# Patient Record
Sex: Female | Born: 1965
Health system: Southern US, Community
[De-identification: ages and names within clinical notes are randomized; demographics above are authoritative.]

## PROBLEM LIST (undated history)

## (undated) DIAGNOSIS — F32A Depression, unspecified: Secondary | ICD-10-CM

## (undated) DIAGNOSIS — K629 Disease of anus and rectum, unspecified: Secondary | ICD-10-CM

## (undated) DIAGNOSIS — M8580 Other specified disorders of bone density and structure, unspecified site: Secondary | ICD-10-CM

## (undated) DIAGNOSIS — F329 Major depressive disorder, single episode, unspecified: Secondary | ICD-10-CM

## (undated) DIAGNOSIS — E0590 Thyrotoxicosis, unspecified without thyrotoxic crisis or storm: Secondary | ICD-10-CM

## (undated) DIAGNOSIS — E079 Disorder of thyroid, unspecified: Secondary | ICD-10-CM

## (undated) DIAGNOSIS — K5790 Diverticulosis of intestine, part unspecified, without perforation or abscess without bleeding: Secondary | ICD-10-CM

## (undated) DIAGNOSIS — F338 Other recurrent depressive disorders: Secondary | ICD-10-CM

## (undated) HISTORY — DX: Depression, unspecified: F32.A

## (undated) HISTORY — PX: WISDOM TOOTH EXTRACTION: SHX21

## (undated) HISTORY — PX: BILATERAL SALPINGOOPHORECTOMY: SHX1223

## (undated) HISTORY — DX: Disorder of thyroid, unspecified: E07.9

## (undated) HISTORY — DX: Major depressive disorder, single episode, unspecified: F32.9

## (undated) HISTORY — PX: COLONOSCOPY: SHX174

---

## 2002-06-10 HISTORY — PX: BREAST SURGERY: SHX581

## 2003-05-11 HISTORY — PX: BREAST ENHANCEMENT SURGERY: SHX7

## 2003-06-11 HISTORY — PX: AUGMENTATION MAMMAPLASTY: SUR837

## 2007-06-11 HISTORY — PX: ABDOMINAL HYSTERECTOMY: SHX81

## 2011-09-17 ENCOUNTER — Encounter: Payer: Self-pay | Admitting: Family Medicine

## 2011-09-17 ENCOUNTER — Ambulatory Visit (INDEPENDENT_AMBULATORY_CARE_PROVIDER_SITE_OTHER): Payer: Federal, State, Local not specified - PPO | Admitting: Family Medicine

## 2011-09-17 VITALS — BP 113/73 | HR 68

## 2011-09-17 DIAGNOSIS — Z01419 Encounter for gynecological examination (general) (routine) without abnormal findings: Secondary | ICD-10-CM

## 2011-09-17 DIAGNOSIS — Z1151 Encounter for screening for human papillomavirus (HPV): Secondary | ICD-10-CM

## 2011-09-17 DIAGNOSIS — F17200 Nicotine dependence, unspecified, uncomplicated: Secondary | ICD-10-CM

## 2011-09-17 DIAGNOSIS — E8940 Asymptomatic postprocedural ovarian failure: Secondary | ICD-10-CM

## 2011-09-17 DIAGNOSIS — Z1231 Encounter for screening mammogram for malignant neoplasm of breast: Secondary | ICD-10-CM

## 2011-09-17 DIAGNOSIS — Z720 Tobacco use: Secondary | ICD-10-CM

## 2011-09-17 DIAGNOSIS — Z124 Encounter for screening for malignant neoplasm of cervix: Secondary | ICD-10-CM

## 2011-09-17 DIAGNOSIS — Z87891 Personal history of nicotine dependence: Secondary | ICD-10-CM | POA: Insufficient documentation

## 2011-09-17 DIAGNOSIS — E8941 Symptomatic postprocedural ovarian failure: Secondary | ICD-10-CM

## 2011-09-17 MED ORDER — ESTRATEST 1.25-2.5 MG PO TABS: 1.0000 | ORAL_TABLET | Freq: Every day | ORAL | Status: DC

## 2011-09-17 MED ORDER — CALCIUM CITRATE-VITAMIN D 200-200 MG-UNIT PO TABS: 1.0000 | ORAL_TABLET | Freq: Every day | ORAL | Status: DC

## 2011-09-17 MED ORDER — ESTRADIOL 1 MG PO TABS: 1.5000 mg | ORAL_TABLET | Freq: Every day | ORAL | Status: DC

## 2011-09-17 MED ORDER — BUPROPION HCL ER (SR) 150 MG PO TB12: 150.0000 mg | ORAL_TABLET | Freq: Two times a day (BID) | ORAL | Status: DC

## 2011-09-17 NOTE — Progress Notes (Signed)
Addended by: Barbara Cower on: 09/17/2011 02:18 PM   Modules accepted: Orders

## 2011-09-17 NOTE — Progress Notes (Signed)
Addended by: Barbara Cower on: 09/17/2011 02:20 PM   Modules accepted: Orders

## 2011-09-17 NOTE — Progress Notes (Signed)
  Subjective:     Beverly Cook is a 46 y.o. female and is here for a comprehensive physical exam. The patient reports no problems.  Reports continued hot flashes and vaginal dryness and diminished libido.  Is on po estrogen at the 1.5 dose.  S/p TAH/BSO in 2009.  Needs mammogram in summer 2013.  Had pap with HRHPV at last yearly in 2012.  No other h/o abnl pap, no leep, no cryo, no ckc.  Seeing PCP in June 2013 for bloodwork, etc.  History   Social History  . Marital Status: Married    Spouse Name: N/A    Number of Children: N/A  . Years of Education: N/A   Occupational History  . Not on file.   Social History Main Topics  . Smoking status: Current Everyday Smoker -- 0.2 packs/day  . Smokeless tobacco: Not on file  . Alcohol Use: Yes  . Drug Use: No  . Sexually Active: Yes    Birth Control/ Protection: Other-see comments   Other Topics Concern  . Not on file   Social History Narrative  . No narrative on file   No health maintenance topics applied.  The following portions of the patient's history were reviewed and updated as appropriate: allergies, current medications, past family history, past medical history, past social history, past surgical history and problem list.  Review of Systems A comprehensive review of systems was negative.   Objective:    BP 113/73  Pulse 68 General appearance: alert, cooperative and appears stated age Head: Normocephalic, without obvious abnormality, atraumatic Neck: no adenopathy, supple, symmetrical, trachea midline and thyroid not enlarged, symmetric, no tenderness/mass/nodules Lungs: clear to auscultation bilaterally Breasts: normal appearance, no masses or tenderness Heart: regular rate and rhythm, S1, S2 normal, no murmur, click, rub or gallop Abdomen: soft, non-tender; bowel sounds normal; no masses,  no organomegaly Pelvic: external genitalia normal, no adnexal masses or tenderness, uterus surgically absent and vagina normal  without discharge Extremities: extremities normal, atraumatic, no cyanosis or edema Pulses: 2+ and symmetric Skin: Skin color, texture, turgor normal. No rashes or lesions Lymph nodes: Cervical, supraclavicular, and axillary nodes normal. Neurologic: Grossly normal    Assessment:    Healthy female exam. Tobacco user Surgical menopause.     Plan:  Pap smear today Mammogram Switch to estratest--f/u this change in 2 mos.    See After Visit Summary for Counseling Recommendations

## 2011-09-17 NOTE — Patient Instructions (Signed)
Preventive Care for Adults, Female A healthy lifestyle and preventive care can promote health and wellness. Preventive health guidelines for women include the following key practices.  A routine yearly physical is a good way to check with your caregiver about your health and preventive screening. It is a chance to share any concerns and updates on your health, and to receive a thorough exam.   Visit your dentist for a routine exam and preventive care every 6 months. Brush your teeth twice a day and floss once a day. Good oral hygiene prevents tooth decay and gum disease.   The frequency of eye exams is based on your age, health, family medical history, use of contact lenses, and other factors. Follow your caregiver's recommendations for frequency of eye exams.   Eat a healthy diet. Foods like vegetables, fruits, whole grains, low-fat dairy products, and lean protein foods contain the nutrients you need without too many calories. Decrease your intake of foods high in solid fats, added sugars, and salt. Eat the right amount of calories for you.Get information about a proper diet from your caregiver, if necessary.   Regular physical exercise is one of the most important things you can do for your health. Most adults should get at least 150 minutes of moderate-intensity exercise (any activity that increases your heart rate and causes you to sweat) each week. In addition, most adults need muscle-strengthening exercises on 2 or more days a week.   Maintain a healthy weight. The body mass index (BMI) is a screening tool to identify possible weight problems. It provides an estimate of body fat based on height and weight. Your caregiver can help determine your BMI, and can help you achieve or maintain a healthy weight.For adults 20 years and older:   A BMI below 18.5 is considered underweight.   A BMI of 18.5 to 24.9 is normal.   A BMI of 25 to 29.9 is considered overweight.   A BMI of 30 and above is  considered obese.   Maintain normal blood lipids and cholesterol levels by exercising and minimizing your intake of saturated fat. Eat a balanced diet with plenty of fruit and vegetables. Blood tests for lipids and cholesterol should begin at age 20 and be repeated every 5 years. If your lipid or cholesterol levels are high, you are over 50, or you are at high risk for heart disease, you may need your cholesterol levels checked more frequently.Ongoing high lipid and cholesterol levels should be treated with medicines if diet and exercise are not effective.   If you smoke, find out from your caregiver how to quit. If you do not use tobacco, do not start.   If you are pregnant, do not drink alcohol. If you are breastfeeding, be very cautious about drinking alcohol. If you are not pregnant and choose to drink alcohol, do not exceed 1 drink per day. One drink is considered to be 12 ounces (355 mL) of beer, 5 ounces (148 mL) of wine, or 1.5 ounces (44 mL) of liquor.   Avoid use of street drugs. Do not share needles with anyone. Ask for help if you need support or instructions about stopping the use of drugs.   High blood pressure causes heart disease and increases the risk of stroke. Your blood pressure should be checked at least every 1 to 2 years. Ongoing high blood pressure should be treated with medicines if weight loss and exercise are not effective.   If you are 55 to 48   years old, ask your caregiver if you should take aspirin to prevent strokes.   Diabetes screening involves taking a blood sample to check your fasting blood sugar level. This should be done once every 3 years, after age 45, if you are within normal weight and without risk factors for diabetes. Testing should be considered at a younger age or be carried out more frequently if you are overweight and have at least 1 risk factor for diabetes.   Breast cancer screening is essential preventive care for women. You should practice "breast  self-awareness." This means understanding the normal appearance and feel of your breasts and may include breast self-examination. Any changes detected, no matter how small, should be reported to a caregiver. Women in their 20s and 30s should have a clinical breast exam (CBE) by a caregiver as part of a regular health exam every 1 to 3 years. After age 40, women should have a CBE every year. Starting at age 40, women should consider having a mammography (breast X-ray test) every year. Women who have a family history of breast cancer should talk to their caregiver about genetic screening. Women at a high risk of breast cancer should talk to their caregivers about having magnetic resonance imaging (MRI) and a mammography every year.   The Pap test is a screening test for cervical cancer. A Pap test can show cell changes on the cervix that might become cervical cancer if left untreated. A Pap test is a procedure in which cells are obtained and examined from the lower end of the uterus (cervix).   Women should have a Pap test starting at age 21.   Between ages 21 and 29, Pap tests should be repeated every 2 years.   Beginning at age 30, you should have a Pap test every 3 years as long as the past 3 Pap tests have been normal.   Some women have medical problems that increase the chance of getting cervical cancer. Talk to your caregiver about these problems. It is especially important to talk to your caregiver if a new problem develops soon after your last Pap test. In these cases, your caregiver may recommend more frequent screening and Pap tests.   The above recommendations are the same for women who have or have not gotten the vaccine for human papillomavirus (HPV).   If you had a hysterectomy for a problem that was not cancer or a condition that could lead to cancer, then you no longer need Pap tests. Even if you no longer need a Pap test, a regular exam is a good idea to make sure no other problems are  starting.   If you are between ages 65 and 70, and you have had normal Pap tests going back 10 years, you no longer need Pap tests. Even if you no longer need a Pap test, a regular exam is a good idea to make sure no other problems are starting.   If you have had past treatment for cervical cancer or a condition that could lead to cancer, you need Pap tests and screening for cancer for at least 20 years after your treatment.   If Pap tests have been discontinued, risk factors (such as a new sexual partner) need to be reassessed to determine if screening should be resumed.   The HPV test is an additional test that may be used for cervical cancer screening. The HPV test looks for the virus that can cause the cell changes on the cervix.   The cells collected during the Pap test can be tested for HPV. The HPV test could be used to screen women aged 30 years and older, and should be used in women of any age who have unclear Pap test results. After the age of 30, women should have HPV testing at the same frequency as a Pap test.   Colorectal cancer can be detected and often prevented. Most routine colorectal cancer screening begins at the age of 50 and continues through age 75. However, your caregiver may recommend screening at an earlier age if you have risk factors for colon cancer. On a yearly basis, your caregiver may provide home test kits to check for hidden blood in the stool. Use of a small camera at the end of a tube, to directly examine the colon (sigmoidoscopy or colonoscopy), can detect the earliest forms of colorectal cancer. Talk to your caregiver about this at age 50, when routine screening begins. Direct examination of the colon should be repeated every 5 to 10 years through age 75, unless early forms of pre-cancerous polyps or small growths are found.   Hepatitis C blood testing is recommended for all people born from 1945 through 1965 and any individual with known risks for hepatitis C.    Practice safe sex. Use condoms and avoid high-risk sexual practices to reduce the spread of sexually transmitted infections (STIs). STIs include gonorrhea, chlamydia, syphilis, trichomonas, herpes, HPV, and human immunodeficiency virus (HIV). Herpes, HIV, and HPV are viral illnesses that have no cure. They can result in disability, cancer, and death. Sexually active women aged 25 and younger should be checked for chlamydia. Older women with new or multiple partners should also be tested for chlamydia. Testing for other STIs is recommended if you are sexually active and at increased risk.   Osteoporosis is a disease in which the bones lose minerals and strength with aging. This can result in serious bone fractures. The risk of osteoporosis can be identified using a bone density scan. Women ages 65 and over and women at risk for fractures or osteoporosis should discuss screening with their caregivers. Ask your caregiver whether you should take a calcium supplement or vitamin D to reduce the rate of osteoporosis.   Menopause can be associated with physical symptoms and risks. Hormone replacement therapy is available to decrease symptoms and risks. You should talk to your caregiver about whether hormone replacement therapy is right for you.   Use sunscreen with sun protection factor (SPF) of 30 or more. Apply sunscreen liberally and repeatedly throughout the day. You should seek shade when your shadow is shorter than you. Protect yourself by wearing long sleeves, pants, a wide-brimmed hat, and sunglasses year round, whenever you are outdoors.   Once a month, do a whole body skin exam, using a mirror to look at the skin on your back. Notify your caregiver of new moles, moles that have irregular borders, moles that are larger than a pencil eraser, or moles that have changed in shape or color.   Stay current with required immunizations.   Influenza. You need a dose every fall (or winter). The composition of  the flu vaccine changes each year, so being vaccinated once is not enough.   Pneumococcal polysaccharide. You need 1 to 2 doses if you smoke cigarettes or if you have certain chronic medical conditions. You need 1 dose at age 65 (or older) if you have never been vaccinated.   Tetanus, diphtheria, pertussis (Tdap, Td). Get 1 dose of   Tdap vaccine if you are younger than age 65, are over 65 and have contact with an infant, are a healthcare worker, are pregnant, or simply want to be protected from whooping cough. After that, you need a Td booster dose every 10 years. Consult your caregiver if you have not had at least 3 tetanus and diphtheria-containing shots sometime in your life or have a deep or dirty wound.   HPV. You need this vaccine if you are a woman age 26 or younger. The vaccine is given in 3 doses over 6 months.   Measles, mumps, rubella (MMR). You need at least 1 dose of MMR if you were born in 1957 or later. You may also need a second dose.   Meningococcal. If you are age 19 to 21 and a first-year college student living in a residence hall, or have one of several medical conditions, you need to get vaccinated against meningococcal disease. You may also need additional booster doses.   Zoster (shingles). If you are age 60 or older, you should get this vaccine.   Varicella (chickenpox). If you have never had chickenpox or you were vaccinated but received only 1 dose, talk to your caregiver to find out if you need this vaccine.   Hepatitis A. You need this vaccine if you have a specific risk factor for hepatitis A virus infection or you simply wish to be protected from this disease. The vaccine is usually given as 2 doses, 6 to 18 months apart.   Hepatitis B. You need this vaccine if you have a specific risk factor for hepatitis B virus infection or you simply wish to be protected from this disease. The vaccine is given in 3 doses, usually over 6 months.  Preventive Services /  Frequency Ages 19 to 39  Blood pressure check.** / Every 1 to 2 years.   Lipid and cholesterol check.** / Every 5 years beginning at age 20.   Clinical breast exam.** / Every 3 years for women in their 20s and 30s.   Pap test.** / Every 2 years from ages 21 through 29. Every 3 years starting at age 30 through age 65 or 70 with a history of 3 consecutive normal Pap tests.   HPV screening.** / Every 3 years from ages 30 through ages 65 to 70 with a history of 3 consecutive normal Pap tests.   Hepatitis C blood test.** / For any individual with known risks for hepatitis C.   Skin self-exam. / Monthly.   Influenza immunization.** / Every year.   Pneumococcal polysaccharide immunization.** / 1 to 2 doses if you smoke cigarettes or if you have certain chronic medical conditions.   Tetanus, diphtheria, pertussis (Tdap, Td) immunization. / A one-time dose of Tdap vaccine. After that, you need a Td booster dose every 10 years.   HPV immunization. / 3 doses over 6 months, if you are 26 and younger.   Measles, mumps, rubella (MMR) immunization. / You need at least 1 dose of MMR if you were born in 1957 or later. You may also need a second dose.   Meningococcal immunization. / 1 dose if you are age 19 to 21 and a first-year college student living in a residence hall, or have one of several medical conditions, you need to get vaccinated against meningococcal disease. You may also need additional booster doses.   Varicella immunization.** / Consult your caregiver.   Hepatitis A immunization.** / Consult your caregiver. 2 doses, 6 to 18 months   apart.   Hepatitis B immunization.** / Consult your caregiver. 3 doses usually over 6 months.  Ages 40 to 64  Blood pressure check.** / Every 1 to 2 years.   Lipid and cholesterol check.** / Every 5 years beginning at age 20.   Clinical breast exam.** / Every year after age 40.   Mammogram.** / Every year beginning at age 40 and continuing for as  long as you are in good health. Consult with your caregiver.   Pap test.** / Every 3 years starting at age 30 through age 65 or 70 with a history of 3 consecutive normal Pap tests.   HPV screening.** / Every 3 years from ages 30 through ages 65 to 70 with a history of 3 consecutive normal Pap tests.   Fecal occult blood test (FOBT) of stool. / Every year beginning at age 50 and continuing until age 75. You may not need to do this test if you get a colonoscopy every 10 years.   Flexible sigmoidoscopy or colonoscopy.** / Every 5 years for a flexible sigmoidoscopy or every 10 years for a colonoscopy beginning at age 50 and continuing until age 75.   Hepatitis C blood test.** / For all people born from 1945 through 1965 and any individual with known risks for hepatitis C.   Skin self-exam. / Monthly.   Influenza immunization.** / Every year.   Pneumococcal polysaccharide immunization.** / 1 to 2 doses if you smoke cigarettes or if you have certain chronic medical conditions.   Tetanus, diphtheria, pertussis (Tdap, Td) immunization.** / A one-time dose of Tdap vaccine. After that, you need a Td booster dose every 10 years.   Measles, mumps, rubella (MMR) immunization. / You need at least 1 dose of MMR if you were born in 1957 or later. You may also need a second dose.   Varicella immunization.** / Consult your caregiver.   Meningococcal immunization.** / Consult your caregiver.   Hepatitis A immunization.** / Consult your caregiver. 2 doses, 6 to 18 months apart.   Hepatitis B immunization.** / Consult your caregiver. 3 doses, usually over 6 months.  Ages 65 and over  Blood pressure check.** / Every 1 to 2 years.   Lipid and cholesterol check.** / Every 5 years beginning at age 20.   Clinical breast exam.** / Every year after age 40.   Mammogram.** / Every year beginning at age 40 and continuing for as long as you are in good health. Consult with your caregiver.   Pap test.** /  Every 3 years starting at age 30 through age 65 or 70 with a 3 consecutive normal Pap tests. Testing can be stopped between 65 and 70 with 3 consecutive normal Pap tests and no abnormal Pap or HPV tests in the past 10 years.   HPV screening.** / Every 3 years from ages 30 through ages 65 or 70 with a history of 3 consecutive normal Pap tests. Testing can be stopped between 65 and 70 with 3 consecutive normal Pap tests and no abnormal Pap or HPV tests in the past 10 years.   Fecal occult blood test (FOBT) of stool. / Every year beginning at age 50 and continuing until age 75. You may not need to do this test if you get a colonoscopy every 10 years.   Flexible sigmoidoscopy or colonoscopy.** / Every 5 years for a flexible sigmoidoscopy or every 10 years for a colonoscopy beginning at age 50 and continuing until age 75.   Hepatitis   C blood test.** / For all people born from 1945 through 1965 and any individual with known risks for hepatitis C.   Osteoporosis screening.** / A one-time screening for women ages 65 and over and women at risk for fractures or osteoporosis.   Skin self-exam. / Monthly.   Influenza immunization.** / Every year.   Pneumococcal polysaccharide immunization.** / 1 dose at age 65 (or older) if you have never been vaccinated.   Tetanus, diphtheria, pertussis (Tdap, Td) immunization. / A one-time dose of Tdap vaccine if you are over 65 and have contact with an infant, are a healthcare worker, or simply want to be protected from whooping cough. After that, you need a Td booster dose every 10 years.   Varicella immunization.** / Consult your caregiver.   Meningococcal immunization.** / Consult your caregiver.   Hepatitis A immunization.** / Consult your caregiver. 2 doses, 6 to 18 months apart.   Hepatitis B immunization.** / Check with your caregiver. 3 doses, usually over 6 months.  ** Family history and personal history of risk and conditions may change your caregiver's  recommendations. Document Released: 07/23/2001 Document Revised: 05/16/2011 Document Reviewed: 10/22/2010 [employer] Patient Information 2012 [employer]. 

## 2011-10-31 ENCOUNTER — Ambulatory Visit: Payer: Self-pay | Admitting: Family Medicine

## 2011-11-05 ENCOUNTER — Ambulatory Visit (INDEPENDENT_AMBULATORY_CARE_PROVIDER_SITE_OTHER): Payer: Federal, State, Local not specified - PPO | Admitting: Family Medicine

## 2011-11-05 ENCOUNTER — Encounter: Payer: Self-pay | Admitting: Family Medicine

## 2011-11-05 VITALS — BP 100/70 | HR 64 | Temp 97.9°F | Ht 64.5 in | Wt 123.0 lb

## 2011-11-05 DIAGNOSIS — F3289 Other specified depressive episodes: Secondary | ICD-10-CM

## 2011-11-05 DIAGNOSIS — F32A Depression, unspecified: Secondary | ICD-10-CM | POA: Insufficient documentation

## 2011-11-05 DIAGNOSIS — E049 Nontoxic goiter, unspecified: Secondary | ICD-10-CM

## 2011-11-05 DIAGNOSIS — F17200 Nicotine dependence, unspecified, uncomplicated: Secondary | ICD-10-CM

## 2011-11-05 DIAGNOSIS — F329 Major depressive disorder, single episode, unspecified: Secondary | ICD-10-CM

## 2011-11-05 DIAGNOSIS — Z720 Tobacco use: Secondary | ICD-10-CM

## 2011-11-05 LAB — T4, FREE: Free T4: 0.84 ng/dL (ref 0.60–1.60)

## 2011-11-05 MED ORDER — BUPROPION HCL ER (XL) 300 MG PO TB24: 300.0000 mg | ORAL_TABLET | Freq: Every day | ORAL | Status: DC

## 2011-11-05 NOTE — Progress Notes (Signed)
Subjective:    Patient ID: Beverly Cook, female    DOB: 1966-02-23, 46 y.o.   MRN: [id number]  HPI  46 yo here to establish care.  Depression- has been on Wellbutrin 200 mg daily. Feels it is controlling her depression ok.  Denies any symptoms of anxiety. Recently moved here from [place] and is away from her two daughter.  She understandably misses them and at times gets tearful.   Feels she has adjusted well to move.  Moved here for the weather, has no family locally.  Working at [employer] place.  Tobacco abuse- cut back to 3 cig/day. Wellbutrin helps and her husband recently quitting has helped her too.  She states she is not ready to quit completely.    Thyroid goiter- per pt, biopsy and ultrasound reassuring last year. Would like labs retested today.  Patient Active Problem List  Diagnoses  . Tobacco user  . Surgical menopause  . Thyroid goiter  . Depression   Past Medical History  Diagnosis Date  . Depression   . Thyroid disease     HYPOTHYRIODISM /GOITER   Past Surgical History  Procedure Date  . Abdominal hysterectomy 2009    T VH  . Breast surgery 2004    BREAST AUGMENTATION  . Wisdom tooth extraction    History  Substance Use Topics  . Smoking status: Current Everyday Smoker -- 0.2 packs/day  . Smokeless tobacco: Not on file  . Alcohol Use: Yes   Family History  Problem Relation Age of Onset  . Hypertension Mother   . Hypertension Father   . Hyperlipidemia Father   . Cancer Paternal Aunt     BREAST  . Diabetes Maternal Grandmother   . Cancer Paternal Aunt     BREAST   No Known Allergies Current Outpatient Prescriptions on File Prior to Visit  Medication Sig Dispense Refill  . calcium citrate-vitamin D 200-200 MG-UNIT TABS Take 1 tablet by mouth daily. 1200 MG  DAILY  60 tablet  12  . Est Estrogens-Methyltest (ESTRATEST) 1.25-2.5 MG TABS Take 1 tablet by mouth daily.  30 each  2   The PMH, PSH, Social History, Family History, Medications, and allergies  have been reviewed in [facility], and have been updated if relevant.   Review of Systems See HPI Patient reports no  vision/ hearing changes,anorexia, weight change, fever ,adenopathy, persistant / recurrent hoarseness, swallowing issues, chest pain, edema,persistant / recurrent cough, hemoptysis, dyspnea(rest, exertional, paroxysmal nocturnal), gastrointestinal  bleeding (melena, rectal bleeding), abdominal pain, excessive heart burn, GU symptoms(dysuria, hematuria, pyuria, voiding/incontinence  Issues) syncope, focal weakness, severe memory loss, concerning skin lesions, depression, anxiety, abnormal bruising/bleeding, major joint swelling, breast masses or abnormal vaginal bleeding.       Objective:   Physical Exam BP 100/70  Pulse 64  Temp 97.9 F (36.6 C)  Ht 5' 4.5" (1.638 m)  Wt 123 lb (55.792 kg)  BMI 20.79 kg/m2  General:  Well-developed,well-nourished,in no acute distress; alert,appropriate and cooperative throughout examination Head:  normocephalic and atraumatic.   Eyes:  vision grossly intact, pupils equal, pupils round, and pupils reactive to light.   Ears:  R ear normal and L ear normal.   Nose:  no external deformity.   Mouth:  good dentition.   Neck:  Pos non tender goiter Lungs:  Normal respiratory effort, chest expands symmetrically. Lungs are clear to auscultation, no crackles or wheezes. Heart:  Normal rate and regular rhythm. S1 and S2 normal without gallop, murmur, click, rub or other extra  sounds. Abdomen:  Bowel sounds positive,abdomen soft and non-tender without masses, organomegaly or hernias noted. Extremities:  No clubbing, cyanosis, edema, or deformity noted with normal full range of motion of all joints.   Neurologic:  alert & oriented X3 and gait normal.   Psych:  Cognition and judgment appear intact. Alert and cooperative with normal attention span and concentration. No apparent delusions, illusions, hallucinations        Assessment & Plan:   1. Thyroid  goiter  Stable- awaiting records. Recheck thyroid function today. TSH, T4, Free  2. Depression  Deteriorated- likely due to move.  Discussed increasing to XL- 300 mg.   The patient indicates understanding of these issues and agrees with the plan.    3. Tobacco user  Improved but not yet ready to quit.  See above.

## 2011-11-05 NOTE — Patient Instructions (Signed)
It was great to meet you. I refilled your Wellbutrin. We will call you with your lab results from today.

## 2011-11-06 ENCOUNTER — Other Ambulatory Visit: Payer: Self-pay | Admitting: Family Medicine

## 2011-11-06 DIAGNOSIS — E049 Nontoxic goiter, unspecified: Secondary | ICD-10-CM

## 2011-11-06 DIAGNOSIS — R7989 Other specified abnormal findings of blood chemistry: Secondary | ICD-10-CM

## 2011-11-18 ENCOUNTER — Ambulatory Visit (HOSPITAL_COMMUNITY)
Admission: RE | Admit: 2011-11-18 | Discharge: 2011-11-18 | Disposition: A | Discharge status: Home / Self Care | Payer: Federal, State, Local not specified - PPO | Source: Ambulatory Visit | Attending: Family Medicine | Admitting: Family Medicine

## 2011-11-18 DIAGNOSIS — Z1231 Encounter for screening mammogram for malignant neoplasm of breast: Secondary | ICD-10-CM | POA: Insufficient documentation

## 2011-12-25 ENCOUNTER — Telehealth: Payer: Self-pay | Admitting: Family Medicine

## 2011-12-25 NOTE — Telephone Encounter (Signed)
Patient left message on machine requesting refill on her Estratest to [facility] [address]

## 2011-12-26 NOTE — Telephone Encounter (Signed)
[facility]

## 2012-07-16 ENCOUNTER — Other Ambulatory Visit: Payer: Self-pay | Admitting: Gynecology

## 2012-07-16 DIAGNOSIS — IMO0002 Reserved for concepts with insufficient information to code with codable children: Secondary | ICD-10-CM

## 2012-07-16 DIAGNOSIS — E348 Other specified endocrine disorders: Secondary | ICD-10-CM

## 2012-07-16 MED ORDER — EST ESTROGENS-METHYLTEST 1.25-2.5 MG PO TABS: 1.0000 | ORAL_TABLET | Freq: Every day | ORAL | Status: DC

## 2012-07-16 NOTE — Progress Notes (Signed)
Patient call for refill for her hormone replacement script. Refill was send to her pharmacy. Patient is also aware to make her annual follow up with the doctor. Per patient will call back to do so.

## 2012-07-27 ENCOUNTER — Other Ambulatory Visit: Payer: Self-pay | Admitting: Family Medicine

## 2012-07-27 ENCOUNTER — Other Ambulatory Visit: Payer: Self-pay | Admitting: Gynecology

## 2012-07-27 DIAGNOSIS — IMO0002 Reserved for concepts with insufficient information to code with codable children: Secondary | ICD-10-CM

## 2012-07-27 DIAGNOSIS — E348 Other specified endocrine disorders: Secondary | ICD-10-CM

## 2012-07-27 DIAGNOSIS — E349 Endocrine disorder, unspecified: Secondary | ICD-10-CM

## 2012-07-27 DIAGNOSIS — E8940 Asymptomatic postprocedural ovarian failure: Secondary | ICD-10-CM

## 2012-07-27 MED ORDER — EST ESTROGENS-METHYLTEST 1.25-2.5 MG PO TABS: 1.0000 | ORAL_TABLET | Freq: Every day | ORAL | Status: DC

## 2012-08-01 ENCOUNTER — Other Ambulatory Visit: Payer: Self-pay | Admitting: Family Medicine

## 2012-08-01 MED ORDER — EST ESTROGENS-METHYLTEST 1.25-2.5 MG PO TABS: 1.0000 | ORAL_TABLET | Freq: Every day | ORAL | Status: DC

## 2012-08-01 NOTE — Addendum Note (Signed)
Addended by: Reva Bores on: 08/01/2012 11:39 AM   Modules accepted: Orders

## 2012-08-01 NOTE — Telephone Encounter (Signed)
E-prescribing error--resent

## 2012-08-04 MED ORDER — EST ESTROGENS-METHYLTEST 1.25-2.5 MG PO TABS: 1.0000 | ORAL_TABLET | Freq: Every day | ORAL | Status: DC

## 2012-08-04 NOTE — Addendum Note (Signed)
Addended by: Reva Bores on: 08/04/2012 11:28 AM   Modules accepted: Orders

## 2012-08-04 NOTE — Telephone Encounter (Signed)
Please phone in--every time I try to send it I get an error.

## 2012-10-26 ENCOUNTER — Ambulatory Visit: Payer: Federal, State, Local not specified - PPO | Admitting: Family Medicine

## 2012-10-26 DIAGNOSIS — Z01419 Encounter for gynecological examination (general) (routine) without abnormal findings: Secondary | ICD-10-CM

## 2012-10-30 ENCOUNTER — Other Ambulatory Visit: Payer: Self-pay | Admitting: Family Medicine

## 2012-10-30 DIAGNOSIS — Z1231 Encounter for screening mammogram for malignant neoplasm of breast: Secondary | ICD-10-CM

## 2012-11-24 ENCOUNTER — Ambulatory Visit (INDEPENDENT_AMBULATORY_CARE_PROVIDER_SITE_OTHER): Payer: Federal, State, Local not specified - PPO | Admitting: Obstetrics & Gynecology

## 2012-11-24 ENCOUNTER — Encounter: Payer: Self-pay | Admitting: Obstetrics & Gynecology

## 2012-11-24 VITALS — BP 86/59 | HR 65 | Ht 65.0 in | Wt 121.0 lb

## 2012-11-24 DIAGNOSIS — Z0000 Encounter for general adult medical examination without abnormal findings: Secondary | ICD-10-CM

## 2012-11-24 DIAGNOSIS — Z01419 Encounter for gynecological examination (general) (routine) without abnormal findings: Secondary | ICD-10-CM

## 2012-11-24 NOTE — Progress Notes (Signed)
Subjective:    Beverly Cook is a 47 y.o. female who presents for an annual exam. The patient has no complaints today. She has hot flashes on estradiol 1.5 mg daily. She tried Designer, industrial/product and she liked it but had such a hard time with refills that she just gave up and went back to estradiol. She would like 3 written prescrioptions for estratest. The patient is sexually active. GYN screening history: last pap: was normal. The patient wears seatbelts: yes. The patient participates in regular exercise: yes. Has the patient ever been transfused or tattooed?: no. The patient reports that there is not domestic violence in her life.   Menstrual History: OB History   Grav Para Term Preterm Abortions TAB SAB Ect Mult Living   2 2        2       Menarche age: 67  No LMP recorded. Patient has had a hysterectomy.    The following portions of the patient's history were reviewed and updated as appropriate: allergies, current medications, past family history, past medical history, past social history, past surgical history and problem list.  Review of Systems A comprehensive review of systems was negative. Married for 12 years, some dryness with sex, uses lube. Works at [employer], Engineer, civil (consulting) in [employer]. Mammogram this Friday. She sees an endocrinologist at [facility] and has an u/s of thyroid today.   Objective:    BP 86/59  Pulse 65  Ht 5\' 5"  (1.651 m)  Wt 121 lb (54.885 kg)  BMI 20.14 kg/m2  General Appearance:    Alert, cooperative, no distress, appears stated age  Head:    Normocephalic, without obvious abnormality, atraumatic  Eyes:    PERRL, conjunctiva/corneas clear, EOM's intact, fundi    benign, both eyes  Ears:    Normal TM's and external ear canals, both ears  Nose:   Nares normal, septum midline, mucosa normal, no drainage    or sinus tenderness  Throat:   Lips, mucosa, and tongue normal; teeth and gums normal  Neck:   Supple, symmetrical, trachea midline, no  adenopathy;    thyroid:  no enlargement/tenderness/nodules; no carotid   bruit or JVD  Back:     Symmetric, no curvature, ROM normal, no CVA tenderness  Lungs:     Clear to auscultation bilaterally, respirations unlabored  Chest Wall:    No tenderness or deformity   Heart:    Regular rate and rhythm, S1 and S2 normal, no murmur, rub   or gallop  Breast Exam:    No tenderness, masses, or nipple abnormality  Abdomen:     Soft, non-tender, bowel sounds active all four quadrants,    no masses, no organomegaly  Genitalia:    Normal female without lesion, discharge or tenderness, minimal atrophy, normal bimanual exam     Extremities:   Extremities normal, atraumatic, no cyanosis or edema  Pulses:   2+ and symmetric all extremities  Skin:   Skin color, texture, turgor normal, no rashes or lesions  Lymph nodes:   Cervical, supraclavicular, and axillary nodes normal  Neurologic:   CNII-XII intact, normal strength, sensation and reflexes    throughout  .    Assessment:    Healthy female exam.   HRT Plan:     written prescriptions for Estratest given.  Mammogram scheduled

## 2012-11-24 NOTE — Progress Notes (Signed)
Here today for gyn physical, no complaints.

## 2012-11-27 ENCOUNTER — Ambulatory Visit (HOSPITAL_COMMUNITY)
Admission: RE | Admit: 2012-11-27 | Discharge: 2012-11-27 | Disposition: A | Discharge status: Home / Self Care | Payer: Federal, State, Local not specified - PPO | Source: Ambulatory Visit | Attending: Family Medicine | Admitting: Family Medicine

## 2012-11-27 DIAGNOSIS — Z1231 Encounter for screening mammogram for malignant neoplasm of breast: Secondary | ICD-10-CM | POA: Insufficient documentation

## 2013-04-30 ENCOUNTER — Telehealth: Payer: Self-pay | Admitting: *Deleted

## 2013-04-30 NOTE — Telephone Encounter (Signed)
Patient is calling because she is still having hot flashes and night sweats and wants to know if you have any other recommendations for her to try.

## 2013-05-04 ENCOUNTER — Telehealth: Payer: Self-pay | Admitting: *Deleted

## 2013-05-04 DIAGNOSIS — N951 Menopausal and female climacteric states: Secondary | ICD-10-CM

## 2013-05-04 MED ORDER — ESTRADIOL 0.1 MG/24HR TD PTWK: 0.1000 mg | MEDICATED_PATCH | TRANSDERMAL | Status: DC

## 2013-05-04 NOTE — Telephone Encounter (Signed)
Patient would like to try the estradiol patches.  This will be called into her pharmacy for her.  She will discontinue the estratest.

## 2013-05-04 NOTE — Telephone Encounter (Signed)
Patient has been taking the estratest for 2 years.  If she takes a whole one she gets an enlarged clitoris.  She is interested in switching to something that is just estrogen and no testosterone.  Please advise.

## 2013-06-28 ENCOUNTER — Telehealth: Payer: Self-pay | Admitting: *Deleted

## 2013-06-28 DIAGNOSIS — Z780 Asymptomatic menopausal state: Secondary | ICD-10-CM

## 2013-06-28 MED ORDER — ESTRADIOL 0.5 MG PO TABS: 1.5000 mg | ORAL_TABLET | Freq: Every day | ORAL | Status: DC

## 2013-06-28 NOTE — Telephone Encounter (Signed)
Message copied by Erik Obey on Mon Jun 28, 2013  4:39 PM ------      Message from: Clovia Cuff C      Created: Mon Jun 28, 2013 10:50 AM       Please give her plain old estradiol 0.5 mg daily, refills for a year.      Thanks ------

## 2013-09-21 ENCOUNTER — Other Ambulatory Visit: Payer: Self-pay

## 2013-09-21 LAB — HEPATIC FUNCTION PANEL A (ARMC)
ALBUMIN: 4 g/dL (ref 3.4–5.0)
Alkaline Phosphatase: 78 U/L
Bilirubin, Direct: 0.2 mg/dL (ref 0.00–0.20)
Bilirubin,Total: 1 mg/dL (ref 0.2–1.0)
SGOT(AST): 26 U/L (ref 15–37)
SGPT (ALT): 27 U/L (ref 12–78)
Total Protein: 7.2 g/dL (ref 6.4–8.2)

## 2013-09-21 LAB — CBC WITH DIFFERENTIAL/PLATELET
BASOS ABS: 0.1 10*3/uL (ref 0.0–0.1)
Basophil %: 1.1 %
Eosinophil #: 0 10*3/uL (ref 0.0–0.7)
Eosinophil %: 0.5 %
HCT: 40.9 % (ref 35.0–47.0)
HGB: 13.8 g/dL (ref 12.0–16.0)
LYMPHS ABS: 1.9 10*3/uL (ref 1.0–3.6)
LYMPHS PCT: 35 %
MCH: 29.3 pg (ref 26.0–34.0)
MCHC: 33.7 g/dL (ref 32.0–36.0)
MCV: 87 fL (ref 80–100)
Monocyte #: 0.3 x10 3/mm (ref 0.2–0.9)
Monocyte %: 6.1 %
Neutrophil #: 3.2 10*3/uL (ref 1.4–6.5)
Neutrophil %: 57.3 %
Platelet: 163 10*3/uL (ref 150–440)
RBC: 4.72 10*6/uL (ref 3.80–5.20)
RDW: 12.3 % (ref 11.5–14.5)
WBC: 5.5 10*3/uL (ref 3.6–11.0)

## 2013-09-21 LAB — T4, FREE: Free Thyroxine: 0.89 ng/dL (ref 0.76–1.46)

## 2013-09-21 LAB — TSH: Thyroid Stimulating Horm: 0.349 u[IU]/mL — ABNORMAL LOW

## 2013-12-07 ENCOUNTER — Other Ambulatory Visit: Payer: Self-pay | Admitting: Family Medicine

## 2013-12-07 DIAGNOSIS — Z136 Encounter for screening for cardiovascular disorders: Secondary | ICD-10-CM

## 2013-12-07 DIAGNOSIS — R7989 Other specified abnormal findings of blood chemistry: Secondary | ICD-10-CM

## 2013-12-07 DIAGNOSIS — E049 Nontoxic goiter, unspecified: Secondary | ICD-10-CM

## 2013-12-07 DIAGNOSIS — Z0000 Encounter for general adult medical examination without abnormal findings: Secondary | ICD-10-CM

## 2013-12-09 ENCOUNTER — Other Ambulatory Visit (INDEPENDENT_AMBULATORY_CARE_PROVIDER_SITE_OTHER): Payer: Federal, State, Local not specified - PPO

## 2013-12-09 DIAGNOSIS — R946 Abnormal results of thyroid function studies: Secondary | ICD-10-CM

## 2013-12-09 DIAGNOSIS — E049 Nontoxic goiter, unspecified: Secondary | ICD-10-CM

## 2013-12-09 DIAGNOSIS — Z0000 Encounter for general adult medical examination without abnormal findings: Secondary | ICD-10-CM

## 2013-12-09 DIAGNOSIS — Z136 Encounter for screening for cardiovascular disorders: Secondary | ICD-10-CM

## 2013-12-09 DIAGNOSIS — R7989 Other specified abnormal findings of blood chemistry: Secondary | ICD-10-CM

## 2013-12-09 LAB — LIPID PANEL
CHOL/HDL RATIO: 3
Cholesterol: 217 mg/dL — ABNORMAL HIGH (ref 0–200)
HDL: 74.3 mg/dL (ref >39.00)
LDL CALC: 129 mg/dL — AB (ref 0–99)
NONHDL: 142.7
Triglycerides: 68 mg/dL (ref 0.0–149.0)
VLDL: 13.6 mg/dL (ref 0.0–40.0)

## 2013-12-09 LAB — COMPREHENSIVE METABOLIC PANEL
ALK PHOS: 66 U/L (ref 39–117)
ALT: 33 U/L (ref 0–35)
AST: 28 U/L (ref 0–37)
Albumin: 4.5 g/dL (ref 3.5–5.2)
BILIRUBIN TOTAL: 1.4 mg/dL — AB (ref 0.2–1.2)
BUN: 13 mg/dL (ref 6–23)
CO2: 31 mEq/L (ref 19–32)
CREATININE: 0.7 mg/dL (ref 0.4–1.2)
Calcium: 9.4 mg/dL (ref 8.4–10.5)
Chloride: 104 mEq/L (ref 96–112)
GFR: 92.51 mL/min (ref >60.00)
Glucose, Bld: 93 mg/dL (ref 70–99)
Potassium: 4.3 mEq/L (ref 3.5–5.1)
Sodium: 140 mEq/L (ref 135–145)
Total Protein: 7.2 g/dL (ref 6.0–8.3)

## 2013-12-09 LAB — CBC WITH DIFFERENTIAL/PLATELET
BASOS ABS: 0 10*3/uL (ref 0.0–0.1)
Basophils Relative: 0.9 % (ref 0.0–3.0)
Eosinophils Absolute: 0 10*3/uL (ref 0.0–0.7)
Eosinophils Relative: 0.5 % (ref 0.0–5.0)
HEMATOCRIT: 43 % (ref 36.0–46.0)
HEMOGLOBIN: 15 g/dL (ref 12.0–15.0)
LYMPHS ABS: 1.7 10*3/uL (ref 0.7–4.0)
Lymphocytes Relative: 34.2 % (ref 12.0–46.0)
MCHC: 35 g/dL (ref 30.0–36.0)
MCV: 85.9 fl (ref 78.0–100.0)
MONO ABS: 0.4 10*3/uL (ref 0.1–1.0)
Monocytes Relative: 7.8 % (ref 3.0–12.0)
NEUTROS ABS: 2.8 10*3/uL (ref 1.4–7.7)
Neutrophils Relative %: 56.6 % (ref 43.0–77.0)
Platelets: 190 10*3/uL (ref 150.0–400.0)
RBC: 5 Mil/uL (ref 3.87–5.11)
RDW: 13.1 % (ref 11.5–15.5)
WBC: 4.9 10*3/uL (ref 4.0–10.5)

## 2013-12-09 LAB — T4, FREE: Free T4: 0.83 ng/dL (ref 0.60–1.60)

## 2013-12-09 LAB — TSH: TSH: 0.57 u[IU]/mL (ref 0.35–4.50)

## 2013-12-16 ENCOUNTER — Encounter: Payer: Self-pay | Admitting: Family Medicine

## 2013-12-16 ENCOUNTER — Ambulatory Visit (INDEPENDENT_AMBULATORY_CARE_PROVIDER_SITE_OTHER): Payer: Federal, State, Local not specified - PPO | Admitting: Family Medicine

## 2013-12-16 VITALS — BP 96/56 | HR 55 | Temp 97.7°F | Ht 64.5 in | Wt 120.2 lb

## 2013-12-16 DIAGNOSIS — Z1211 Encounter for screening for malignant neoplasm of colon: Secondary | ICD-10-CM

## 2013-12-16 DIAGNOSIS — F3289 Other specified depressive episodes: Secondary | ICD-10-CM

## 2013-12-16 DIAGNOSIS — F329 Major depressive disorder, single episode, unspecified: Secondary | ICD-10-CM

## 2013-12-16 DIAGNOSIS — F32A Depression, unspecified: Secondary | ICD-10-CM

## 2013-12-16 DIAGNOSIS — E8940 Asymptomatic postprocedural ovarian failure: Secondary | ICD-10-CM

## 2013-12-16 DIAGNOSIS — E049 Nontoxic goiter, unspecified: Secondary | ICD-10-CM

## 2013-12-16 DIAGNOSIS — F17200 Nicotine dependence, unspecified, uncomplicated: Secondary | ICD-10-CM

## 2013-12-16 DIAGNOSIS — E8941 Symptomatic postprocedural ovarian failure: Secondary | ICD-10-CM

## 2013-12-16 DIAGNOSIS — Z0000 Encounter for general adult medical examination without abnormal findings: Secondary | ICD-10-CM | POA: Insufficient documentation

## 2013-12-16 DIAGNOSIS — M542 Cervicalgia: Secondary | ICD-10-CM

## 2013-12-16 DIAGNOSIS — Z720 Tobacco use: Secondary | ICD-10-CM

## 2013-12-16 MED ORDER — CYCLOBENZAPRINE HCL 5 MG PO TABS: ORAL_TABLET | ORAL | Status: DC

## 2013-12-16 NOTE — Assessment & Plan Note (Signed)
Followed by GYN. Off HRT.

## 2013-12-16 NOTE — Assessment & Plan Note (Signed)
Trapezius spasm- advised massage, prn flexeril at night as needed.

## 2013-12-16 NOTE — Assessment & Plan Note (Signed)
Reviewed preventive care protocols, scheduled due services, and updated immunizations Discussed nutrition, exercise, diet, and healthy lifestyle.  Refer to GI for colonoscopy. 

## 2013-12-16 NOTE — Assessment & Plan Note (Signed)
Thyroid function normal off rx. Followed by endocrinology.

## 2013-12-16 NOTE — Patient Instructions (Signed)
Great to see you. We will call you with your colonoscopy appointment.  (you will have to meet with the doctor first).

## 2013-12-16 NOTE — Progress Notes (Signed)
Subjective:   Patient ID: Beverly Cook, female    DOB: [birth year], 48 y.o.   MRN: [id number]  Beverly Cook is a pleasant 48 y.o. year old female who presents to clinic today with Annual Exam and Neck Pain  on 12/16/2013  HPI: Mammogram 11/27/12. She will be scheduling follow up mammogram.  GYN- Dr. Hulan Fray- last saw her on 11/24/12- note reviewed. S/p hysterectomy.  Was on HRT but stopped taking months ago it because was having unwanted side effects. Still having hot flashes but wants to wait it out.  Neck pain- left sided- woke up over 3 months ago with neck stiffness. No tingling down left arm.  Has tried icy hot. No UE weakness.  Has not been taking NSAIDs for this.  Thyroid goiter- seeing endocrinology. Lab Results  Component Value Date   TSH 0.57 12/09/2013   Has never had a colonoscopy.  Denies any changes in bowel habits and blood in stool.  No current outpatient prescriptions on file prior to visit.   No current facility-administered medications on file prior to visit.    No Known Allergies  Past Medical History  Diagnosis Date  . Depression   . Thyroid disease     HYPOTHYRIODISM /GOITER    Past Surgical History  Procedure Laterality Date  . Abdominal hysterectomy  2009    T VH  . Breast surgery  2004    BREAST AUGMENTATION  . Wisdom tooth extraction      Family History  Problem Relation Age of Onset  . Hypertension Mother   . Hypertension Father   . Hyperlipidemia Father   . Cancer Paternal Aunt     BREAST  . Diabetes Maternal Grandmother   . Cancer Paternal Aunt     BREAST    History   Social History  . Marital Status: Married    Spouse Name: N/A    Number of Children: N/A  . Years of Education: N/A   Occupational History  . Not on file.   Social History Main Topics  . Smoking status: Current Every Day Smoker -- 0.25 packs/day  . Smokeless tobacco: Not on file  . Alcohol Use: Yes  . Drug Use: No  . Sexual Activity: Yes    Birth Control/  Protection: Other-see comments   Other Topics Concern  . Not on file   Social History Narrative  . No narrative on file   The PMH, PSH, Social History, Family History, Medications, and allergies have been reviewed in [facility], and have been updated if relevant.      Review of Systems    See HPI Denies any changes in bowel habits or skin No anxiety or depression No dysuria No back pain No nausea or vomiting No CP or SOB- very physically active- walks and bikes regularly Objective:    BP 96/56  Pulse 55  Temp(Src) 97.7 F (36.5 C) (Oral)  Ht 5' 4.5" (1.638 m)  Wt 120 lb 4 oz (54.545 kg)  BMI 20.33 kg/m2  SpO2 97%   Physical Exam   General:  Well-developed,well-nourished,in no acute distress; alert,appropriate and cooperative throughout examination Head:  normocephalic and atraumatic.   Eyes:  vision grossly intact, pupils equal, pupils round, and pupils reactive to light.   Ears:  R ear normal and L ear normal.   Nose:  no external deformity.   Mouth:  good dentition.   Neck:  No deformities, masses,  Left trapezius TTP, FROM of neck Neck arch sign Lungs:  Normal  respiratory effort, chest expands symmetrically. Lungs are clear to auscultation, no crackles or wheezes. Heart:  Normal rate and regular rhythm. S1 and S2 normal without gallop, murmur, click, rub or other extra sounds. Abdomen:  Bowel sounds positive,abdomen soft and non-tender without masses, organomegaly or hernias noted. Msk:  No deformity or scoliosis noted of thoracic or lumbar spine.   Extremities:  No clubbing, cyanosis, edema, or deformity noted with normal full range of motion of all joints.   Neurologic:  alert & oriented X3 and gait normal.   Skin:  Intact without suspicious lesions or rashes Cervical Nodes:  No lymphadenopathy noted Axillary Nodes:  No palpable lymphadenopathy Psych:  Cognition and judgment appear intact. Alert and cooperative with normal attention span and concentration. No  apparent delusions, illusions, hallucinations       Assessment & Plan:   Routine general medical examination at a health care facility  Neck pain  Tobacco user  Thyroid goiter  Surgical menopause  Depression No Follow-up on file.

## 2013-12-16 NOTE — Progress Notes (Signed)
Pre visit review using our clinic review tool, if applicable. No additional management support is needed unless otherwise documented below in the visit note. 

## 2013-12-30 ENCOUNTER — Encounter: Payer: Self-pay | Admitting: Obstetrics & Gynecology

## 2013-12-30 ENCOUNTER — Ambulatory Visit (INDEPENDENT_AMBULATORY_CARE_PROVIDER_SITE_OTHER): Payer: Federal, State, Local not specified - PPO | Admitting: Obstetrics & Gynecology

## 2013-12-30 VITALS — BP 104/71 | HR 70 | Ht 64.5 in | Wt 121.6 lb

## 2013-12-30 DIAGNOSIS — Z0000 Encounter for general adult medical examination without abnormal findings: Secondary | ICD-10-CM

## 2013-12-30 DIAGNOSIS — Z01419 Encounter for gynecological examination (general) (routine) without abnormal findings: Secondary | ICD-10-CM

## 2013-12-30 DIAGNOSIS — Z1151 Encounter for screening for human papillomavirus (HPV): Secondary | ICD-10-CM

## 2013-12-30 DIAGNOSIS — Z1272 Encounter for screening for malignant neoplasm of vagina: Secondary | ICD-10-CM

## 2013-12-30 NOTE — Progress Notes (Signed)
Subjective:    Beverly Cook is a 48 y.o. MW P2 (21 and 24 kids out of the house) female who presents for an annual exam. The patient has no complaints today. The patient is sexually active. GYN screening history: last pap: was normal. The patient wears seatbelts: yes. The patient participates in regular exercise: yes. Has the patient ever been transfused or tattooed?: no. The patient reports that there is not domestic violence in her life.   Menstrual History: OB History   Grav Para Term Preterm Abortions TAB SAB Ect Mult Living   2 2        2       Menarche age: 73  No LMP recorded. Patient has had a hysterectomy.    The following portions of the patient's history were reviewed and updated as appropriate: allergies, current medications, past family history, past medical history, past social history, past surgical history and problem list.  Review of Systems A comprehensive review of systems was negative. Married for 13 years, some post coital pain due to dryness, uses a lube. 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   Objective:    BP 104/71  Pulse 70  Ht 5' 4.5" (1.638 m)  Wt 121 lb 9.6 oz (55.157 kg)  BMI 20.56 kg/m2  General Appearance:    Alert, cooperative, no distress, appears stated age  Head:    Normocephalic, without obvious abnormality, atraumatic  Eyes:    PERRL, conjunctiva/corneas clear, EOM's intact, fundi    benign, both eyes  Ears:    Normal TM's and external ear canals, both ears  Nose:   Nares normal, septum midline, mucosa normal, no drainage    or sinus tenderness  Throat:   Lips, mucosa, and tongue normal; teeth and gums normal  Neck:   Supple, symmetrical, trachea midline, no adenopathy;    thyroid:  no enlargement/tenderness/nodules; no carotid   bruit or JVD  Back:     Symmetric, no curvature, ROM normal, no CVA tenderness  Lungs:     Clear to auscultation bilaterally, respirations unlabored  Chest Wall:    No tenderness or deformity   Heart:    Regular  rate and rhythm, S1 and S2 normal, no murmur, rub   or gallop  Breast Exam:    No tenderness, masses, or nipple abnormality  Abdomen:     Soft, non-tender, bowel sounds active all four quadrants,    no masses, no organomegaly  Genitalia:    Normal female without lesion, discharge or tenderness, Normal vagina, cuff, and no masses on bimanual exam     Extremities:   Extremities normal, atraumatic, no cyanosis or edema  Pulses:   2+ and symmetric all extremities  Skin:   Skin color, texture, turgor normal, no rashes or lesions  Lymph nodes:   Cervical, supraclavicular, and axillary nodes normal  Neurologic:   CNII-XII intact, normal strength, sensation and reflexes    throughout  .    Assessment:    Healthy female exam.    Plan:   Rec [facility] Schedule mammogram

## 2013-12-30 NOTE — Addendum Note (Signed)
Addended by: Emily Filbert on: 12/30/2013 10:01 AM   Modules accepted: Orders

## 2014-02-10 ENCOUNTER — Ambulatory Visit: Payer: Self-pay | Admitting: Obstetrics & Gynecology

## 2014-02-10 ENCOUNTER — Telehealth: Payer: Self-pay

## 2014-02-10 NOTE — Telephone Encounter (Signed)
Pt will be traveling to [place] in 2 weeks and wanted to know if any needed immunizations prior to traveling. Pt will ck with [facility] and also can ck CDC guidelines. Pt will cb if needed.

## 2014-03-24 ENCOUNTER — Other Ambulatory Visit: Payer: Self-pay | Admitting: *Deleted

## 2014-03-24 MED ORDER — CYCLOBENZAPRINE HCL 5 MG PO TABS: ORAL_TABLET | ORAL | Status: DC

## 2014-04-11 ENCOUNTER — Encounter: Payer: Self-pay | Admitting: Obstetrics & Gynecology

## 2014-04-15 ENCOUNTER — Ambulatory Visit: Payer: Self-pay | Admitting: Gastroenterology

## 2014-06-06 ENCOUNTER — Encounter: Payer: Self-pay | Admitting: Family Medicine

## 2014-06-13 ENCOUNTER — Ambulatory Visit: Payer: Self-pay | Admitting: Gastroenterology

## 2014-06-23 ENCOUNTER — Encounter: Payer: Self-pay | Admitting: Family Medicine

## 2014-07-07 ENCOUNTER — Ambulatory Visit: Payer: Self-pay | Admitting: Oncology

## 2014-07-11 ENCOUNTER — Encounter: Payer: Self-pay | Admitting: Family Medicine

## 2014-07-19 ENCOUNTER — Ambulatory Visit: Payer: Self-pay | Admitting: Surgery

## 2014-08-09 ENCOUNTER — Encounter: Payer: Self-pay | Admitting: Family Medicine

## 2014-09-16 ENCOUNTER — Encounter: Payer: Self-pay | Admitting: *Deleted

## 2014-10-03 LAB — SURGICAL PATHOLOGY

## 2015-01-05 DIAGNOSIS — S46911A Strain of unspecified muscle, fascia and tendon at shoulder and upper arm level, right arm, initial encounter: Secondary | ICD-10-CM | POA: Insufficient documentation

## 2015-01-05 DIAGNOSIS — M47816 Spondylosis without myelopathy or radiculopathy, lumbar region: Secondary | ICD-10-CM | POA: Insufficient documentation

## 2015-02-16 ENCOUNTER — Ambulatory Visit: Payer: Federal, State, Local not specified - PPO | Admitting: Obstetrics & Gynecology

## 2015-03-24 ENCOUNTER — Ambulatory Visit (INDEPENDENT_AMBULATORY_CARE_PROVIDER_SITE_OTHER): Payer: Federal, State, Local not specified - PPO | Admitting: Obstetrics & Gynecology

## 2015-03-24 ENCOUNTER — Encounter: Payer: Self-pay | Admitting: Obstetrics & Gynecology

## 2015-03-24 VITALS — BP 98/63 | HR 78 | Ht 64.0 in | Wt 125.0 lb

## 2015-03-24 DIAGNOSIS — Z01419 Encounter for gynecological examination (general) (routine) without abnormal findings: Secondary | ICD-10-CM

## 2015-03-24 DIAGNOSIS — Z0000 Encounter for general adult medical examination without abnormal findings: Secondary | ICD-10-CM

## 2015-03-24 NOTE — Progress Notes (Signed)
Subjective:    Beverly Cook is a 49 y.o. MW P2 ( 70 and 73 yo kids- empty nester) female who presents for an annual exam. The patient has no complaints today. The patient is sexually active. GYN screening history: last pap: was normal. The patient wears seatbelts: yes. The patient participates in regular exercise: yes. (bicycling) Has the patient ever been transfused or tattooed?: no. The patient reports that there is not domestic violence in her life.   Menstrual History: OB History    Gravida Para Term Preterm AB TAB SAB Ectopic Multiple Living   2 2        2       Menarche age: 86  No LMP recorded. Patient has had a hysterectomy.    The following portions of the patient's history were reviewed and updated as appropriate: allergies, current medications, past family history, past medical history, past social history, past surgical history and problem list.  Review of Systems Pertinent items noted in HPI and remainder of comprehensive ROS otherwise negative. 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 Occasional dyspareunia, uses lubricant. Had colonoscopy 2015 (had HPV lesion in her rectum and had removal of that lesion). Will repeat colonoscopy soon.   Objective:    BP 98/63 mmHg  Pulse 78  Ht 5\' 4"  (1.626 m)  Wt 125 lb (56.7 kg)  BMI 21.45 kg/m2  General Appearance:    Alert, cooperative, no distress, appears stated age  Head:    Normocephalic, without obvious abnormality, atraumatic  Eyes:    PERRL, conjunctiva/corneas clear, EOM's intact, fundi    benign, both eyes  Ears:    Normal TM's and external ear canals, both ears  Nose:   Nares normal, septum midline, mucosa normal, no drainage    or sinus tenderness  Throat:   Lips, mucosa, and tongue normal; teeth and gums normal  Neck:   Supple, symmetrical, trachea midline, no adenopathy;    thyroid:  no enlargement/tenderness/nodules; no carotid   bruit or JVD  Back:     Symmetric, no curvature, ROM normal, no CVA tenderness   Lungs:     Clear to auscultation bilaterally, respirations unlabored  Chest Wall:    No tenderness or deformity   Heart:    Regular rate and rhythm, S1 and S2 normal, no murmur, rub   or gallop  Breast Exam:    No tenderness, masses, or nipple abnormality  Abdomen:     Soft, non-tender, bowel sounds active all four quadrants,    no masses, no organomegaly  Genitalia:    Normal female without lesion, discharge or tenderness     Extremities:   Extremities normal, atraumatic, no cyanosis or edema  Pulses:   2+ and symmetric all extremities  Skin:   Skin color, texture, turgor normal, no rashes or lesions  Lymph nodes:   Cervical, supraclavicular, and axillary nodes normal  Neurologic:   CNII-XII intact, normal strength, sensation and reflexes    throughout  .    Assessment:    Healthy female exam.    Plan:     Breast self exam technique reviewed and patient encouraged to perform self-exam monthly. Mammogram.  Discussed dense breasts, offered 3D. She will call her insurance company to see if it is covered.

## 2015-03-30 ENCOUNTER — Other Ambulatory Visit: Payer: Self-pay | Admitting: Obstetrics & Gynecology

## 2015-03-30 ENCOUNTER — Ambulatory Visit
Admission: RE | Admit: 2015-03-30 | Discharge: 2015-03-30 | Disposition: A | Discharge status: Home / Self Care | Payer: Federal, State, Local not specified - PPO | Source: Ambulatory Visit | Attending: Obstetrics & Gynecology | Admitting: Obstetrics & Gynecology

## 2015-03-30 DIAGNOSIS — Z0000 Encounter for general adult medical examination without abnormal findings: Secondary | ICD-10-CM

## 2015-03-30 DIAGNOSIS — Z1231 Encounter for screening mammogram for malignant neoplasm of breast: Secondary | ICD-10-CM | POA: Diagnosis present

## 2015-06-11 HISTORY — PX: RECTAL SURGERY: SHX760

## 2015-06-26 ENCOUNTER — Other Ambulatory Visit: Payer: Self-pay | Admitting: Family Medicine

## 2015-06-26 DIAGNOSIS — Z01419 Encounter for gynecological examination (general) (routine) without abnormal findings: Secondary | ICD-10-CM

## 2015-06-29 ENCOUNTER — Encounter: Payer: Self-pay | Admitting: *Deleted

## 2015-06-30 ENCOUNTER — Ambulatory Visit
Admission: RE | Admit: 2015-06-30 | Discharge: 2015-06-30 | Disposition: A | Discharge status: Home Health | Payer: Federal, State, Local not specified - PPO | Source: Ambulatory Visit | Attending: Gastroenterology | Admitting: Gastroenterology

## 2015-06-30 ENCOUNTER — Other Ambulatory Visit (INDEPENDENT_AMBULATORY_CARE_PROVIDER_SITE_OTHER): Payer: Federal, State, Local not specified - PPO

## 2015-06-30 ENCOUNTER — Encounter: Payer: Self-pay | Admitting: *Deleted

## 2015-06-30 ENCOUNTER — Encounter
Admission: RE | Disposition: A | Discharge status: Home Health | Payer: Self-pay | Source: Ambulatory Visit | Attending: Gastroenterology

## 2015-06-30 DIAGNOSIS — F329 Major depressive disorder, single episode, unspecified: Secondary | ICD-10-CM | POA: Diagnosis not present

## 2015-06-30 DIAGNOSIS — Z01419 Encounter for gynecological examination (general) (routine) without abnormal findings: Secondary | ICD-10-CM

## 2015-06-30 DIAGNOSIS — M8580 Other specified disorders of bone density and structure, unspecified site: Secondary | ICD-10-CM | POA: Insufficient documentation

## 2015-06-30 DIAGNOSIS — Z79899 Other long term (current) drug therapy: Secondary | ICD-10-CM | POA: Insufficient documentation

## 2015-06-30 DIAGNOSIS — E039 Hypothyroidism, unspecified: Secondary | ICD-10-CM | POA: Diagnosis not present

## 2015-06-30 DIAGNOSIS — K6289 Other specified diseases of anus and rectum: Secondary | ICD-10-CM | POA: Diagnosis present

## 2015-06-30 DIAGNOSIS — Z0000 Encounter for general adult medical examination without abnormal findings: Secondary | ICD-10-CM

## 2015-06-30 DIAGNOSIS — R85613 High grade squamous intraepithelial lesion on cytologic smear of anus (HGSIL): Secondary | ICD-10-CM | POA: Insufficient documentation

## 2015-06-30 DIAGNOSIS — K621 Rectal polyp: Secondary | ICD-10-CM | POA: Insufficient documentation

## 2015-06-30 HISTORY — DX: Other specified disorders of bone density and structure, unspecified site: M85.80

## 2015-06-30 HISTORY — DX: Disease of anus and rectum, unspecified: K62.9

## 2015-06-30 HISTORY — PX: FLEXIBLE SIGMOIDOSCOPY: SHX5431

## 2015-06-30 LAB — CBC WITH DIFFERENTIAL/PLATELET
BASOS ABS: 0 10*3/uL (ref 0.0–0.1)
Basophils Relative: 0.8 % (ref 0.0–3.0)
EOS ABS: 0 10*3/uL (ref 0.0–0.7)
Eosinophils Relative: 0.5 % (ref 0.0–5.0)
HCT: 44.7 % (ref 36.0–46.0)
Hemoglobin: 15 g/dL (ref 12.0–15.0)
LYMPHS ABS: 1.8 10*3/uL (ref 0.7–4.0)
Lymphocytes Relative: 37.8 % (ref 12.0–46.0)
MCHC: 33.6 g/dL (ref 30.0–36.0)
MCV: 86.9 fl (ref 78.0–100.0)
MONO ABS: 0.4 10*3/uL (ref 0.1–1.0)
MONOS PCT: 7.3 % (ref 3.0–12.0)
NEUTROS PCT: 53.6 % (ref 43.0–77.0)
Neutro Abs: 2.6 10*3/uL (ref 1.4–7.7)
Platelets: 205 10*3/uL (ref 150.0–400.0)
RBC: 5.14 Mil/uL — AB (ref 3.87–5.11)
RDW: 13 % (ref 11.5–15.5)
WBC: 4.9 10*3/uL (ref 4.0–10.5)

## 2015-06-30 LAB — COMPREHENSIVE METABOLIC PANEL
ALK PHOS: 71 U/L (ref 39–117)
ALT: 31 U/L (ref 0–35)
AST: 28 U/L (ref 0–37)
Albumin: 4.6 g/dL (ref 3.5–5.2)
BILIRUBIN TOTAL: 1.7 mg/dL — AB (ref 0.2–1.2)
BUN: 11 mg/dL (ref 6–23)
CO2: 27 mEq/L (ref 19–32)
Calcium: 9.6 mg/dL (ref 8.4–10.5)
Chloride: 107 mEq/L (ref 96–112)
Creatinine, Ser: 0.81 mg/dL (ref 0.40–1.20)
GFR: 78.97 mL/min (ref >60.00)
GLUCOSE: 86 mg/dL (ref 70–99)
Potassium: 4.2 mEq/L (ref 3.5–5.1)
SODIUM: 142 meq/L (ref 135–145)
TOTAL PROTEIN: 7.2 g/dL (ref 6.0–8.3)

## 2015-06-30 LAB — LIPID PANEL
CHOL/HDL RATIO: 3
Cholesterol: 201 mg/dL — ABNORMAL HIGH (ref 0–200)
HDL: 67.6 mg/dL (ref >39.00)
LDL Cholesterol: 118 mg/dL — ABNORMAL HIGH (ref 0–99)
NONHDL: 132.97
Triglycerides: 74 mg/dL (ref 0.0–149.0)
VLDL: 14.8 mg/dL (ref 0.0–40.0)

## 2015-06-30 LAB — TSH: TSH: 0.6 u[IU]/mL (ref 0.35–4.50)

## 2015-06-30 LAB — VITAMIN B12: Vitamin B-12: 322 pg/mL (ref 211–911)

## 2015-06-30 SURGERY — SIGMOIDOSCOPY, FLEXIBLE
Anesthesia: General

## 2015-06-30 MED ORDER — SODIUM CHLORIDE 0.9 % IV SOLN: INTRAVENOUS | Status: DC

## 2015-06-30 MED ORDER — FENTANYL CITRATE (PF) 100 MCG/2ML IJ SOLN
INTRAMUSCULAR | Status: AC
  Filled 2015-06-30: qty 2

## 2015-06-30 MED ORDER — LIDOCAINE HCL 2 % EX GEL
CUTANEOUS | Status: DC
Start: 2015-06-30 — End: 2015-06-30
  Filled 2015-06-30: qty 5

## 2015-06-30 MED ORDER — SODIUM CHLORIDE 0.9 % IV SOLN
INTRAVENOUS | Status: DC
Start: 2015-06-30 — End: 2015-06-30
  Administered 2015-06-30: 15:00:00 via INTRAVENOUS

## 2015-06-30 MED ORDER — MIDAZOLAM HCL 5 MG/5ML IJ SOLN
INTRAMUSCULAR | Status: AC
  Filled 2015-06-30: qty 5

## 2015-06-30 MED ORDER — LIDOCAINE HCL 2 % EX GEL
CUTANEOUS | Status: DC | PRN
  Administered 2015-06-30: 1

## 2015-06-30 NOTE — H&P (Signed)
Outpatient short stay form Pre-procedure 06/30/2015 4:08 PM Lollie Sails MD  Primary Physician: Dr. 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 Is returning today for flexible sigmoidoscopy for recheck on the site. Previous flexible sigmoidoscopy there was a small tattoo placed. Is lesion was at about the 5:00 position in the posterior anal canal. She had a full prep done for the procedure.    Current facility-administered medications:  .  0.9 %  sodium chloride infusion, , Intravenous, Continuous, Lollie Sails, MD, Last Rate: 20 mL/hr at 06/30/15 1431 .  0.9 %  sodium chloride infusion, , Intravenous, Continuous, Lollie Sails, MD  Prescriptions prior to admission  Medication Sig Dispense Refill Last Dose  . calcium citrate-vitamin D 200-200 MG-UNIT TABS Take 1 tablet by mouth daily. 600 MG  DAILY   Past Week at Unknown time     No Known Allergies   Past Medical History  Diagnosis Date  . Depression   . Thyroid disease     HYPOTHYRIODISM /GOITER  . Osteopenia   . Lesion of rectum     Review of systems:      Physical Exam    Heart and lungs: Regular rate and rhythm without rub or gallop, lungs are bilaterally clear.    HEENT: Norm cephalic atraumatic eyes are anicteric    Other:     Pertinant exam for procedure: Soft nontender nondistended bowel sounds positive normoactive.    Planned proceedures: Flexible sigmoidoscopy and indicated procedures. We will use some optimal lidocaine at the anal orifice. Needed we may use conscious sedation however we'll proceed initially with out. I have discussed the risks benefits and complications of procedures to include not limited to bleeding, infection, perforation and the risk of sedation and the patient wishes to  proceed.    Lollie Sails, MD Gastroenterology 06/30/2015  4:08 PM

## 2015-06-30 NOTE — Op Note (Signed)
[facility] Gastroenterology Patient Name: Beverly Cook Procedure Date: 06/30/2015 4:10 PM MRN: [id number] Account #: [id number] Date of Birth: [birth year] Admit Type: Outpatient Age: 50 Room: [facility] ENDO ROOM 3 Gender: Female Note Status: Finalized Procedure:         Flexible Sigmoidoscopy Providers:         Lollie Sails, MD Referring MD:      Marciano Sequin. Deborra Medina, MD (Referring MD) Medicines:         2% lidocaine jelly locally at the anal verge. Complications:     No immediate complications. Procedure:         Pre-Anesthesia Assessment:                    - ASA Grade Assessment: II - A patient with mild systemic                     disease.                    After obtaining informed consent, the scope was passed                     under direct vision. The Colonoscope was introduced                     through the anus and advanced to the 20 cm from the anal                     verge. The flexible sigmoidoscopy was accomplished without                     difficulty. The patient tolerated the procedure well. The                     quality of the bowel preparation was excellent. Findings:      The colon up to the 20 cm mark was normal in appearance. A small       papillary lesion is noted adkacent to the dentate line, with the       previous tatoo noted ;adjacent. biposy was taken with a cold forcep       after application of 2% lidocaine jelly.      A 2 mm polypoid lesion was found at the anus. The lesion was       semi-pedunculated. No bleeding was present. Biopsies were taken with a       cold forceps for histology. Impression:        - Papillary lesion noted at the anal verge. Biopsied. Recommendation:    - Await pathology results. Procedure Code(s): --- Professional ---                    938 011 [id number], Sigmoidoscopy, flexible; with biopsy, single or                     multiple CPT copyright 2014 American Medical Association. All rights reserved. The codes  documented in this report are preliminary and upon coder review may  be revised to meet current compliance requirements. Lollie Sails, MD 06/30/2015 4:47:19 PM This report has been signed electronically. Number of Addenda: 0 Note Initiated On: 06/30/2015 4:10 PM      [facility]

## 2015-07-04 ENCOUNTER — Encounter: Payer: Self-pay | Admitting: Family Medicine

## 2015-07-04 ENCOUNTER — Ambulatory Visit (INDEPENDENT_AMBULATORY_CARE_PROVIDER_SITE_OTHER): Payer: Federal, State, Local not specified - PPO | Admitting: Family Medicine

## 2015-07-04 VITALS — BP 124/66 | HR 66 | Temp 98.0°F | Ht 64.5 in | Wt 123.2 lb

## 2015-07-04 DIAGNOSIS — Z720 Tobacco use: Secondary | ICD-10-CM | POA: Diagnosis not present

## 2015-07-04 DIAGNOSIS — Z0000 Encounter for general adult medical examination without abnormal findings: Secondary | ICD-10-CM

## 2015-07-04 DIAGNOSIS — R946 Abnormal results of thyroid function studies: Secondary | ICD-10-CM

## 2015-07-04 DIAGNOSIS — R7989 Other specified abnormal findings of blood chemistry: Secondary | ICD-10-CM

## 2015-07-04 DIAGNOSIS — F32A Depression, unspecified: Secondary | ICD-10-CM

## 2015-07-04 DIAGNOSIS — F329 Major depressive disorder, single episode, unspecified: Secondary | ICD-10-CM | POA: Diagnosis not present

## 2015-07-04 LAB — SURGICAL PATHOLOGY

## 2015-07-04 MED ORDER — CYCLOBENZAPRINE HCL 5 MG PO TABS: 5.0000 mg | ORAL_TABLET | Freq: Three times a day (TID) | ORAL | Status: DC | PRN

## 2015-07-04 NOTE — Progress Notes (Signed)
Pre visit review using our clinic review tool, if applicable. No additional management support is needed unless otherwise documented below in the visit note. 

## 2015-07-04 NOTE — Progress Notes (Signed)
Subjective:   Patient ID: Beverly Cook, female    DOB: 1965/07/21, 50 y.o.   MRN: [id number]  Deajia Halperin is a pleasant 50 y.o. year old female who presents to clinic today with Annual Exam and Foot Pain  on 07/04/2015  HPI:  Doing well.  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  Mammogram 03/31/15.   GYN- Dr. Hulan Fray- last saw her on 03/24/15-note reviewed. S/p hysterectomy.    Thyroid goiter- does not take thyroid replacement. Lab Results  Component Value Date   TSH 0.60 06/30/2015   Flex sig on 06/30/15- Dr. Gustavo Lah.  Denies any changes in bowel habits and blood in stool.  Lab Results  Component Value Date   CHOL 201* 06/30/2015   HDL 67.60 06/30/2015   LDLCALC 118* 06/30/2015   TRIG 74.0 06/30/2015   CHOLHDL 3 06/30/2015   Lab Results  Component Value Date   CREATININE 0.81 06/30/2015   Lab Results  Component Value Date   WBC 4.9 06/30/2015   HGB 15.0 06/30/2015   HCT 44.7 06/30/2015   MCV 86.9 06/30/2015   PLT 205.0 06/30/2015   Lab Results  Component Value Date   NA 142 06/30/2015   K 4.2 06/30/2015   CL 107 06/30/2015   CO2 27 06/30/2015   Lab Results  Component Value Date   ALT 31 06/30/2015   AST 28 06/30/2015   ALKPHOS 71 06/30/2015   BILITOT 1.7* 06/30/2015    Current Outpatient Prescriptions on File Prior to Visit  Medication Sig Dispense Refill  . calcium citrate-vitamin D 200-200 MG-UNIT TABS Take 1 tablet by mouth daily. 600 MG  DAILY     No current facility-administered medications on file prior to visit.    No Known Allergies  Past Medical History  Diagnosis Date  . Depression   . Thyroid disease     HYPOTHYRIODISM /GOITER  . Osteopenia   . Lesion of rectum     Past Surgical History  Procedure Laterality Date  . Abdominal hysterectomy  2009    T VH  . Breast surgery  2004    BREAST AUGMENTATION  . Wisdom tooth extraction    . Augmentation mammaplasty Bilateral 2005  . Colonoscopy      Family History    Problem Relation Age of Onset  . Hypertension Mother   . Hypertension Father   . Hyperlipidemia Father   . Cancer Paternal Aunt     BREAST  . Diabetes Maternal Grandmother   . Cancer Paternal Aunt     BREAST    Social History   Social History  . Marital Status: Married    Spouse Name: N/A  . Number of Children: N/A  . Years of Education: N/A   Occupational History  . Not on file.   Social History Main Topics  . Smoking status: Current Every Day Smoker -- 0.25 packs/day  . Smokeless tobacco: Never Used  . Alcohol Use: 0.6 oz/week    1 Glasses of wine per week  . Drug Use: No  . Sexual Activity:    Partners: Male    Birth Control/ Protection: Other-see comments   Other Topics Concern  . Not on file   Social History Narrative   The PMH, PSH, Social History, Family History, Medications, and allergies have been reviewed in [facility], and have been updated if relevant.      Review of Systems  Constitutional: Negative.   HENT: Negative.   Cardiovascular: Negative.   Gastrointestinal: Negative.  Endocrine: Negative.   Genitourinary: Negative.   Musculoskeletal: Negative.   Skin: Negative.   Allergic/Immunologic: Negative.   Neurological: Negative.   Psychiatric/Behavioral: Negative.   All other systems reviewed and are negative.      Objective:    BP 124/66 mmHg  Pulse 66  Temp(Src) 98 F (36.7 C) (Oral)  Ht 5' 4.5" (1.638 m)  Wt 123 lb 4 oz (55.906 kg)  BMI 20.84 kg/m2  SpO2 97%   Physical Exam   General:  Well-developed,well-nourished,in no acute distress; alert,appropriate and cooperative throughout examination Head:  normocephalic and atraumatic.   Eyes:  vision grossly intact, pupils equal, pupils round, and pupils reactive to light.   Ears:  R ear normal and L ear normal.   Nose:  no external deformity.   Mouth:  good dentition.   Neck:  No deformities, masses Neck arch sign Lungs:  Normal respiratory effort, chest expands symmetrically.  Lungs are clear to auscultation, no crackles or wheezes. Heart:  Normal rate and regular rhythm. S1 and S2 normal without gallop, murmur, click, rub or other extra sounds. Abdomen:  Bowel sounds positive,abdomen soft and non-tender without masses, organomegaly or hernias noted. Msk:  No deformity or scoliosis noted of thoracic or lumbar spine.   Extremities:  No clubbing, cyanosis, edema, or deformity noted with normal full range of motion of all joints.   Neurologic:  alert & oriented X3 and gait normal.   Skin:  Intact without suspicious lesions or rashes Cervical Nodes:  No lymphadenopathy noted Axillary Nodes:  No palpable lymphadenopathy Psych:  Cognition and judgment appear intact. Alert and cooperative with normal attention span and concentration. No apparent delusions, illusions, hallucinations       Assessment & Plan:   Routine general medical examination at a health care facility  Tobacco user  Low TSH level  Depression No Follow-up on file.

## 2015-07-04 NOTE — Assessment & Plan Note (Signed)
Reviewed preventive care protocols, scheduled due services, and updated immunizations Discussed nutrition, exercise, diet, and healthy lifestyle.  

## 2015-07-05 ENCOUNTER — Encounter: Payer: Self-pay | Admitting: Gastroenterology

## 2015-09-25 DIAGNOSIS — K080 Exfoliation of teeth due to systemic causes: Secondary | ICD-10-CM | POA: Diagnosis not present

## 2015-10-25 ENCOUNTER — Ambulatory Visit (INDEPENDENT_AMBULATORY_CARE_PROVIDER_SITE_OTHER): Payer: Federal, State, Local not specified - PPO | Admitting: Podiatry

## 2015-10-25 ENCOUNTER — Ambulatory Visit (INDEPENDENT_AMBULATORY_CARE_PROVIDER_SITE_OTHER): Payer: Federal, State, Local not specified - PPO

## 2015-10-25 ENCOUNTER — Encounter: Payer: Self-pay | Admitting: Podiatry

## 2015-10-25 VITALS — BP 103/64 | HR 74 | Resp 16

## 2015-10-25 DIAGNOSIS — M722 Plantar fascial fibromatosis: Secondary | ICD-10-CM | POA: Diagnosis not present

## 2015-10-25 DIAGNOSIS — D485 Neoplasm of uncertain behavior of skin: Secondary | ICD-10-CM | POA: Diagnosis not present

## 2015-10-25 DIAGNOSIS — M79673 Pain in unspecified foot: Secondary | ICD-10-CM

## 2015-10-25 DIAGNOSIS — B977 Papillomavirus as the cause of diseases classified elsewhere: Secondary | ICD-10-CM | POA: Diagnosis not present

## 2015-10-25 DIAGNOSIS — R208 Other disturbances of skin sensation: Secondary | ICD-10-CM | POA: Diagnosis not present

## 2015-10-25 MED ORDER — METHYLPREDNISOLONE 4 MG PO TBPK: ORAL_TABLET | ORAL | Status: DC

## 2015-10-25 MED ORDER — MELOXICAM 15 MG PO TABS: 15.0000 mg | ORAL_TABLET | Freq: Every day | ORAL | Status: DC

## 2015-10-25 NOTE — Patient Instructions (Signed)

## 2015-10-25 NOTE — Progress Notes (Signed)
   Subjective:    Patient ID: Beverly Cook, female    DOB: Dec 24, 1965, 50 y.o.   MRN: [id number]  HPI: She presents today with a several month duration of pain to the bilateral foot she develops burning and pain in the heel and radiates to the forefoot. States that she has tried new para tissues which has helped to some degree otherwise it's all of her standing throughout the day because the majority of her pain. 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    Review of Systems  Musculoskeletal: Positive for back pain.  All other systems reviewed and are negative.      Objective:   Physical Exam: Vital signs are stable alert and oriented 3. Pulses are palpable. Neurologic sensorium is intact. Deep tendon reflexes are intact. Muscle strength is normal bilateral. Orthopedic evaluation was resolved with just distal to the ankle for range of motion or crepitation. She has pain on palpation medial calcaneal tubercles bilateral heels. Radiographs confirm plantar distally oriented calcaneal heel spur as well as soft tissue increase in density of the plantar fascia insertion site. Cutaneous evaluations traceable well-hydrated daily is no erythema cellulitis drainage or odor. No open lesions.        Assessment & Plan:  Plantar fasciitis bilateral.  Plan: Injected her bilateral heels today with Kenalog and local anesthetic surgery were Medrol Dosepak to be followed by meloxicam. Placed her in plantar fascia braces bilateral I did not dispense a night splint. We discussed appropriate shoe gear stretching exercises and ice therapy. I will follow-up with her in 1 month.

## 2015-12-11 ENCOUNTER — Telehealth: Payer: Self-pay

## 2015-12-11 NOTE — Telephone Encounter (Signed)
Spoke to pt and advised per Dr Deborra Medina. Pt will contact office and schedule OV if problem persist after she returns

## 2015-12-11 NOTE — Telephone Encounter (Signed)
Pt left v/m; pts husband was seen last week with poison oak and given prednisone; pt now has poison oak all over her body and is in [place]. Pt request prednisone to [facility] pharmacy in [place]. Pt request cb.

## 2015-12-11 NOTE — Telephone Encounter (Signed)
Unfortunately, I really cannot send in oral prednisone without evaluating her.  I recommend that she go to an urgent care in [place].  Please keep [place] updated.

## 2016-02-22 DIAGNOSIS — Z8619 Personal history of other infectious and parasitic diseases: Secondary | ICD-10-CM | POA: Diagnosis not present

## 2016-02-22 DIAGNOSIS — L821 Other seborrheic keratosis: Secondary | ICD-10-CM | POA: Diagnosis not present

## 2016-02-22 DIAGNOSIS — L99 Other disorders of skin and subcutaneous tissue in diseases classified elsewhere: Secondary | ICD-10-CM | POA: Diagnosis not present

## 2016-04-02 ENCOUNTER — Encounter: Payer: Self-pay | Admitting: Physician Assistant

## 2016-04-02 ENCOUNTER — Ambulatory Visit: Payer: Self-pay | Admitting: Physician Assistant

## 2016-04-02 VITALS — BP 112/76 | HR 76 | Temp 98.3°F

## 2016-04-02 DIAGNOSIS — J029 Acute pharyngitis, unspecified: Secondary | ICD-10-CM

## 2016-04-02 MED ORDER — AZITHROMYCIN 250 MG PO TABS: ORAL_TABLET | ORAL | 0 refills | Status: DC

## 2016-04-02 NOTE — Progress Notes (Signed)
S: C/o sore throat for 1.5 weeks, no fever, chills, cp/sob, v/d; feels like mucus is stuck in the back of her throat but doesn't have any sinus congestion   Using otc meds:   O: PE: vitals wnl, nad, perrl eomi, normocephalic, tms dull, nasal mucosa red and swollen, throat injected, neck supple no lymph, lungs c t a, cv rrr, neuro intact  A:  Acute pharyngitis   P: drink fluids, continue regular meds , use otc meds of choice, return if not improving in 5 days, return earlier if worsening , zpack

## 2016-04-03 DIAGNOSIS — K629 Disease of anus and rectum, unspecified: Secondary | ICD-10-CM | POA: Diagnosis not present

## 2016-04-04 DIAGNOSIS — K080 Exfoliation of teeth due to systemic causes: Secondary | ICD-10-CM | POA: Diagnosis not present

## 2016-04-11 ENCOUNTER — Ambulatory Visit: Payer: Federal, State, Local not specified - PPO | Admitting: Podiatry

## 2016-04-15 ENCOUNTER — Ambulatory Visit: Payer: Self-pay | Admitting: Family

## 2016-04-15 ENCOUNTER — Encounter: Payer: Self-pay | Admitting: Family

## 2016-04-15 VITALS — BP 100/64 | HR 78 | Temp 98.9°F

## 2016-04-15 DIAGNOSIS — J019 Acute sinusitis, unspecified: Secondary | ICD-10-CM

## 2016-04-15 MED ORDER — AMOXICILLIN 875 MG PO TABS: 875.0000 mg | ORAL_TABLET | Freq: Two times a day (BID) | ORAL | 0 refills | Status: DC

## 2016-04-15 NOTE — Progress Notes (Signed)
S/ was tx 2 wks ago with zpack , was better for 3 days and states her sxs have reoccurred. ST ,hurts to swallow, not blowing, coughing ,dry ;denies fever , SOB ,GI sx ,body aches, + malaise/fatigue Denies allergies O/ VSS alert NAD, ENT nasal mucosa swollen, inflamed , face nontender, throat injected, neck supple without nodes, heart rsr lungs clear  A/ rhinosinusitis P/ amoxicillan  Supportive measures discussed. Follow up prn not improving

## 2016-04-18 ENCOUNTER — Ambulatory Visit: Payer: Federal, State, Local not specified - PPO | Admitting: Obstetrics & Gynecology

## 2016-04-18 ENCOUNTER — Encounter: Payer: Self-pay | Admitting: Podiatry

## 2016-04-18 ENCOUNTER — Ambulatory Visit (INDEPENDENT_AMBULATORY_CARE_PROVIDER_SITE_OTHER): Payer: Federal, State, Local not specified - PPO | Admitting: Podiatry

## 2016-04-18 DIAGNOSIS — M7742 Metatarsalgia, left foot: Secondary | ICD-10-CM | POA: Diagnosis not present

## 2016-04-18 DIAGNOSIS — M722 Plantar fascial fibromatosis: Secondary | ICD-10-CM

## 2016-04-18 DIAGNOSIS — M7741 Metatarsalgia, right foot: Secondary | ICD-10-CM

## 2016-04-19 NOTE — Progress Notes (Signed)
Subjective: 50 year old female presents the office they for concerns of bilateral heel and pain in the ball of her feet which is been ongoing for several months. She last seen in May and she had an injection which she states helped some and she was in the stretching, icing however she is not having significant relief. She also took anti-inflammatories and the steroids which helped some but she continued to have pain. Prior to coming to the office in May she was having ongoing symptoms she states for quite sometime assessment now been going on for almost a year. She denies a recent injury or trauma. No swelling or redness. She is an occasional numbness to her toes as she does a lot of standing and walking and this is not continuous. Denies any systemic complaints such as fevers, chills, nausea, vomiting. No acute changes since last appointment, and no other complaints at this time.   Objective: AAO x3, NAD DP/PT pulses palpable bilaterally, CRT less than 3 seconds There is mild discomfort to the medial band of the plantar fascia on the insertion into the heels bilaterally. There is also prominence the metatarsal heads plantarly with atrophy of the fat pad is diffuse tenderness metatarsal heads 1-5. There is an increase in medial arch height. There is no specific area pinpoint bony tenderness is no pain vibratory sensation. Ankle, subtalar joint range of motion intact. No other areas of tenderness bilaterally. No open lesions or pre-ulcerative lesions.  No pain with calf compression, swelling, warmth, erythema  Assessment: Bilateral chronic heel pain; metatarsalgia  Plan: -All treatment options discussed with the patient including all alternatives, risks, complications.  -Discussed with her various treatment options. At this point I recommended custom inserts. She has tried over-the-counter inserts which helped some but is not bright complete relief. She wishes to proceed with inserts. She was scanned  for orthotics were sent to [facility] labs. -Continue stretching, icing as well as anti-inflammatories needed. -I discussed a steroid injection today however she'll didn't have significant relief of the first one we will start with the new orthotic. -Follow-up in 3 weeks to [facility] or sooner if any problems arise. In the meantime, encouraged to call the office with any questions, concerns, change in symptoms.   Celesta Gentile, DPM

## 2016-05-16 ENCOUNTER — Ambulatory Visit: Payer: Federal, State, Local not specified - PPO | Admitting: Podiatry

## 2016-05-16 ENCOUNTER — Ambulatory Visit: Payer: Federal, State, Local not specified - PPO | Admitting: Obstetrics & Gynecology

## 2016-05-16 ENCOUNTER — Ambulatory Visit: Admit: 2016-05-16 | Payer: Federal, State, Local not specified - PPO | Admitting: Gastroenterology

## 2016-05-16 SURGERY — SIGMOIDOSCOPY, FLEXIBLE

## 2016-05-23 ENCOUNTER — Other Ambulatory Visit (HOSPITAL_COMMUNITY): Payer: Self-pay | Admitting: Obstetrics & Gynecology

## 2016-05-23 DIAGNOSIS — Z1231 Encounter for screening mammogram for malignant neoplasm of breast: Secondary | ICD-10-CM

## 2016-06-13 ENCOUNTER — Other Ambulatory Visit: Payer: Self-pay | Admitting: Family Medicine

## 2016-06-14 NOTE — Telephone Encounter (Signed)
Last refill 07/04/15, last ov 07/04/15

## 2016-06-18 ENCOUNTER — Ambulatory Visit (INDEPENDENT_AMBULATORY_CARE_PROVIDER_SITE_OTHER): Payer: Federal, State, Local not specified - PPO | Admitting: Obstetrics & Gynecology

## 2016-06-18 ENCOUNTER — Encounter: Payer: Self-pay | Admitting: Obstetrics & Gynecology

## 2016-06-18 VITALS — BP 89/65 | HR 80 | Resp 18 | Ht 64.0 in | Wt 128.0 lb

## 2016-06-18 DIAGNOSIS — Z0000 Encounter for general adult medical examination without abnormal findings: Secondary | ICD-10-CM

## 2016-06-18 DIAGNOSIS — Z01419 Encounter for gynecological examination (general) (routine) without abnormal findings: Secondary | ICD-10-CM

## 2016-06-18 DIAGNOSIS — Z803 Family history of malignant neoplasm of breast: Secondary | ICD-10-CM | POA: Diagnosis not present

## 2016-06-18 MED ORDER — ESTRADIOL 10 MCG VA TABS: ORAL_TABLET | VAGINAL | 12 refills | Status: DC

## 2016-06-18 NOTE — Progress Notes (Signed)
Subjective:    Beverly Cook is a 51 y.o. MW P2 (22 and 28 yo kids) female who presents for an annual exam. The patient has no complaints today. She is having some dryness and pain with sex. The patient is sexually active. GYN screening history: last pap: was normal. The patient wears seatbelts: yes. The patient participates in regular exercise: yes. Has the patient ever been transfused or tattooed?: no. The patient reports that there is not domestic violence in her life.   Menstrual History: OB History    Gravida Para Term Preterm AB Living   2 2       2    SAB TAB Ectopic Multiple Live Births                  Menarche age: 45 No LMP recorded. Patient has had a hysterectomy.    The following portions of the patient's history were reviewed and updated as appropriate: allergies, current medications, past family history, past medical history, past social history, past surgical history and problem list.  Review of Systems Pertinent items are noted in HPI.   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, decreased libido for her and husband   Objective:    BP (!) 89/65 (BP Location: Left Arm, Patient Position: Sitting, Cuff Size: Normal)   Pulse 80   Resp 18   Ht 5\' 4"  (1.626 m)   Wt 128 lb (58.1 kg)   BMI 21.97 kg/m   General Appearance:    Alert, cooperative, no distress, appears stated age  Head:    Normocephalic, without obvious abnormality, atraumatic  Eyes:    PERRL, conjunctiva/corneas clear, EOM's intact, fundi    benign, both eyes  Ears:    Normal TM's and external ear canals, both ears  Nose:   Nares normal, septum midline, mucosa normal, no drainage    or sinus tenderness  Throat:   Lips, mucosa, and tongue normal; teeth and gums normal  Neck:   Supple, symmetrical, trachea midline, no adenopathy;    thyroid:  no enlargement/tenderness/nodules; no carotid   bruit or JVD  Back:     Symmetric, no curvature, ROM normal, no CVA tenderness  Lungs:     Clear to  auscultation bilaterally, respirations unlabored  Chest Wall:    No tenderness or deformity   Heart:    Regular rate and rhythm, S1 and S2 normal, no murmur, rub   or gallop  Breast Exam:    No tenderness, masses, or nipple abnormality  Abdomen:     Soft, non-tender, bowel sounds active all four quadrants,    no masses, no organomegaly  Genitalia:    Normal female without lesion, discharge or tenderness, marked V V A, normal vagina and cuff, negative bimanual exam     Extremities:   Extremities normal, atraumatic, no cyanosis or edema  Pulses:   2+ and symmetric all extremities  Skin:   Skin color, texture, turgor normal, no rashes or lesions  Lymph nodes:   Cervical, supraclavicular, and axillary nodes normal  Neurologic:   CNII-XII intact, normal strength, sensation and reflexes    throughout   .    Assessment:    Healthy female exam.   Vaginal dryness, pain with sex   Plan:     Mammogram.  already scheduled Vitamin D level

## 2016-06-18 NOTE — Addendum Note (Signed)
Addended by: Gretchen Short on: 06/18/2016 11:33 AM   Modules accepted: Orders

## 2016-06-18 NOTE — Progress Notes (Signed)
Pt here today for annual exam, c/o pain with intercourse and vaginal dryness.

## 2016-06-19 LAB — VITAMIN D 25 HYDROXY (VIT D DEFICIENCY, FRACTURES): VIT D 25 HYDROXY: 28.3 ng/mL — AB (ref 30.0–100.0)

## 2016-06-20 ENCOUNTER — Telehealth: Payer: Self-pay | Admitting: *Deleted

## 2016-06-20 DIAGNOSIS — R7989 Other specified abnormal findings of blood chemistry: Secondary | ICD-10-CM

## 2016-06-20 MED ORDER — VITAMIN D (ERGOCALCIFEROL) 1.25 MG (50000 UNIT) PO CAPS: 50000.0000 [IU] | ORAL_CAPSULE | ORAL | 0 refills | Status: DC

## 2016-06-20 NOTE — Telephone Encounter (Signed)
-----   Message from Emily Filbert, MD sent at 06/20/2016  9:35 AM EST ----- She will need 8 weeks of weekly Vitamin D 50,000 units and then a recheck of her level. Thanks

## 2016-06-20 NOTE — Telephone Encounter (Signed)
Called pt, no answer, left message about result and recommendation.  Sent rx to pharmacy.  Instructed pt to call the office with any questions.

## 2016-06-21 ENCOUNTER — Telehealth: Payer: Self-pay | Admitting: Family Medicine

## 2016-06-21 DIAGNOSIS — Z0000 Encounter for general adult medical examination without abnormal findings: Secondary | ICD-10-CM

## 2016-06-21 NOTE — Telephone Encounter (Signed)
Can you please order Vit D, CBC, CMET, lipid panel and TSH with code z00.00.  I dont want to enter this wrong.  Thanks

## 2016-06-21 NOTE — Telephone Encounter (Signed)
Patient scheduled appointment on 07/04/16 for a physical.  Patient is asking for orders to be put in Epic,so she can have her lab work done at [facility].

## 2016-06-24 ENCOUNTER — Encounter: Payer: Self-pay | Admitting: *Deleted

## 2016-06-25 DIAGNOSIS — H903 Sensorineural hearing loss, bilateral: Secondary | ICD-10-CM | POA: Diagnosis not present

## 2016-06-25 DIAGNOSIS — H9319 Tinnitus, unspecified ear: Secondary | ICD-10-CM | POA: Diagnosis not present

## 2016-06-25 DIAGNOSIS — H9313 Tinnitus, bilateral: Secondary | ICD-10-CM | POA: Diagnosis not present

## 2016-07-01 NOTE — Telephone Encounter (Signed)
Pt left v/m that she went to hospital to get labs done and there were no orders.Please advise. Pt request cb.

## 2016-07-01 NOTE — Telephone Encounter (Signed)
I do not know how to order labs to have done at hospital. Each hospital has their own in house lab, not an outside reference lab. I placed the orders in EPIC, which the hospital lab should be able to see and reorder for their lab if needed. I called pt and notified her of this.

## 2016-07-02 ENCOUNTER — Other Ambulatory Visit
Admission: RE | Admit: 2016-07-02 | Discharge: 2016-07-02 | Disposition: A | Discharge status: Home / Self Care | Payer: Federal, State, Local not specified - PPO | Source: Ambulatory Visit | Attending: Family Medicine | Admitting: Family Medicine

## 2016-07-02 ENCOUNTER — Ambulatory Visit
Admission: RE | Admit: 2016-07-02 | Discharge: 2016-07-02 | Disposition: A | Discharge status: Home / Self Care | Payer: Federal, State, Local not specified - PPO | Source: Ambulatory Visit | Attending: Obstetrics & Gynecology | Admitting: Obstetrics & Gynecology

## 2016-07-02 DIAGNOSIS — Z1231 Encounter for screening mammogram for malignant neoplasm of breast: Secondary | ICD-10-CM | POA: Diagnosis not present

## 2016-07-02 DIAGNOSIS — Z0000 Encounter for general adult medical examination without abnormal findings: Secondary | ICD-10-CM | POA: Diagnosis not present

## 2016-07-02 DIAGNOSIS — Z9882 Breast implant status: Secondary | ICD-10-CM | POA: Diagnosis not present

## 2016-07-02 LAB — LIPID PANEL
CHOL/HDL RATIO: 3.5 ratio
CHOLESTEROL: 241 mg/dL — AB (ref 0–200)
HDL: 68 mg/dL (ref >40)
LDL Cholesterol: 152 mg/dL — ABNORMAL HIGH (ref 0–99)
TRIGLYCERIDES: 103 mg/dL (ref <150)
VLDL: 21 mg/dL (ref 0–40)

## 2016-07-02 LAB — CBC WITH DIFFERENTIAL/PLATELET
BASOS PCT: 0 %
Band Neutrophils: 0 %
Basophils Absolute: 0 10*3/uL (ref 0–0.1)
Blasts: 0 %
EOS PCT: 0 %
Eosinophils Absolute: 0 10*3/uL (ref 0–0.7)
HEMATOCRIT: 40.4 % (ref 35.0–47.0)
HEMOGLOBIN: 14.4 g/dL (ref 12.0–16.0)
LYMPHS PCT: 39 %
Lymphs Abs: 2 10*3/uL (ref 1.0–3.6)
MCH: 30 pg (ref 26.0–34.0)
MCHC: 35.6 g/dL (ref 32.0–36.0)
MCV: 84.2 fL (ref 80.0–100.0)
MONO ABS: 0.3 10*3/uL (ref 0.2–0.9)
MYELOCYTES: 0 %
Metamyelocytes Relative: 0 %
Monocytes Relative: 5 %
NEUTROS PCT: 56 %
NRBC: 0 /100{WBCs}
Neutro Abs: 2.7 10*3/uL (ref 1.4–6.5)
Other: 0 %
PROMYELOCYTES ABS: 0 %
Platelets: 184 10*3/uL (ref 150–440)
RBC: 4.8 MIL/uL (ref 3.80–5.20)
RDW: 13 % (ref 11.5–14.5)
WBC: 5 10*3/uL (ref 3.6–11.0)

## 2016-07-02 LAB — COMPREHENSIVE METABOLIC PANEL
ALK PHOS: 61 U/L (ref 38–126)
ALT: 24 U/L (ref 14–54)
ANION GAP: 7 (ref 5–15)
AST: 25 U/L (ref 15–41)
Albumin: 4.6 g/dL (ref 3.5–5.0)
BILIRUBIN TOTAL: 1.2 mg/dL (ref 0.3–1.2)
BUN: 13 mg/dL (ref 6–20)
CALCIUM: 9.2 mg/dL (ref 8.9–10.3)
CO2: 30 mmol/L (ref 22–32)
Chloride: 104 mmol/L (ref 101–111)
Creatinine, Ser: 0.72 mg/dL (ref 0.44–1.00)
Glucose, Bld: 94 mg/dL (ref 65–99)
POTASSIUM: 4 mmol/L (ref 3.5–5.1)
Sodium: 141 mmol/L (ref 135–145)
TOTAL PROTEIN: 7.5 g/dL (ref 6.5–8.1)

## 2016-07-02 LAB — TSH: TSH: 0.687 u[IU]/mL (ref 0.350–4.500)

## 2016-07-03 ENCOUNTER — Encounter: Payer: Self-pay | Admitting: *Deleted

## 2016-07-04 ENCOUNTER — Ambulatory Visit (INDEPENDENT_AMBULATORY_CARE_PROVIDER_SITE_OTHER): Payer: Federal, State, Local not specified - PPO | Admitting: Family Medicine

## 2016-07-04 ENCOUNTER — Ambulatory Visit (INDEPENDENT_AMBULATORY_CARE_PROVIDER_SITE_OTHER): Payer: Federal, State, Local not specified - PPO | Admitting: Podiatry

## 2016-07-04 ENCOUNTER — Encounter: Payer: Self-pay | Admitting: Cardiology

## 2016-07-04 ENCOUNTER — Encounter: Payer: Self-pay | Admitting: Family Medicine

## 2016-07-04 ENCOUNTER — Ambulatory Visit (INDEPENDENT_AMBULATORY_CARE_PROVIDER_SITE_OTHER): Payer: Federal, State, Local not specified - PPO | Admitting: Cardiology

## 2016-07-04 VITALS — BP 112/64 | HR 64 | Temp 97.9°F | Ht 64.0 in | Wt 127.5 lb

## 2016-07-04 VITALS — BP 106/62 | HR 72 | Ht 64.0 in | Wt 130.0 lb

## 2016-07-04 DIAGNOSIS — L84 Corns and callosities: Secondary | ICD-10-CM | POA: Diagnosis not present

## 2016-07-04 DIAGNOSIS — Z0000 Encounter for general adult medical examination without abnormal findings: Secondary | ICD-10-CM

## 2016-07-04 DIAGNOSIS — R000 Tachycardia, unspecified: Secondary | ICD-10-CM

## 2016-07-04 DIAGNOSIS — E785 Hyperlipidemia, unspecified: Secondary | ICD-10-CM | POA: Diagnosis not present

## 2016-07-04 DIAGNOSIS — R002 Palpitations: Secondary | ICD-10-CM

## 2016-07-04 DIAGNOSIS — M722 Plantar fascial fibromatosis: Secondary | ICD-10-CM | POA: Diagnosis not present

## 2016-07-04 DIAGNOSIS — F17200 Nicotine dependence, unspecified, uncomplicated: Secondary | ICD-10-CM

## 2016-07-04 DIAGNOSIS — E8940 Asymptomatic postprocedural ovarian failure: Secondary | ICD-10-CM

## 2016-07-04 NOTE — Patient Instructions (Addendum)
Testing/Procedures: Your physician has requested that you have a stress echocardiogram. For further information please visit [email or web address]. Please follow instruction sheet as given.  Your physician has recommended that you wear an event monitor. Event monitors are medical devices that record the heart's electrical activity. Doctors most often [place] these monitors to diagnose arrhythmias. Arrhythmias are problems with the speed or rhythm of the heartbeat. The monitor is a small, portable device. You can wear one while you do your normal daily activities. This is usually used to diagnose what is causing palpitations/syncope (passing out).   Do not drink or eat foods with caffeine for 24 hours before the test. (Chocolate, coffee, tea, or energy drinks)  If you use an inhaler, bring it with you to the test.  Do not smoke for 4 hours before the test.  Wear comfortable shoes and clothing.  Follow-Up: Your physician recommends that you schedule a follow-up appointment as needed with Dr. Yvone Neu. We will call you with results and if needed schedule follow up at that time.    It was a pleasure seeing you today here in the office. Please do not hesitate to give [place] a call back if you have any further questions. [place], BSN      Exercise Stress Echocardiogram An exercise stress echocardiogram is a test to check how well your heart is working. This test uses sound waves (ultrasound) and a computer to make images of your heart before and after exercise. Ultrasound images that are taken before you exercise (your resting echocardiogram) will show how much blood is getting to your heart muscle and how well your heart muscle and heart valves are functioning. During the next part of this test, you will walk on a treadmill or ride a stationary bike to see how exercise affects your heart. While you exercise, the electrical activity of your heart will be monitored with an  electrocardiogram (ECG). Your blood pressure will also be monitored. You may have this test if you:  Have chest pain or other symptoms of a heart problem.  Recently had a heart attack or heart surgery.  Have heart valve problems.  Have a condition that causes narrowing of the blood vessels that supply your heart (coronary artery disease).  Have a high risk of heart disease and are starting a new exercise program.  Have a high risk of heart disease and need to have major surgery. Tell a health care provider about:  Any allergies you have.  All medicines you are taking, including vitamins, herbs, eye drops, creams, and over-the-counter medicines.  Any problems you or family members have had with anesthetic medicines.  Any blood disorders you have.  Any surgeries you have had.  Any medical conditions you have.  Whether you are pregnant or may be pregnant. What are the risks? Generally, this is a safe procedure. However, problems may occur, including:  Chest pain.  Dizziness or light-headedness.  Shortness of breath.  Increased or irregular heartbeat (palpitations).  Nausea or vomiting.  Heart attack (very rare). What happens before the procedure?  Follow instructions from your health care provider about eating or drinking restrictions. You may be asked to avoid all forms of caffeine for 24 hours before your procedure, or as told by your health care provider.  Ask your health care provider about changing or stopping your regular medicines. This is especially important if you are taking diabetes medicines or blood thinners.  If you use an inhaler, bring it with you  to the test.  Wear loose, comfortable clothing and walking shoes.  Do notuse any products that contain nicotine or tobacco, such as cigarettes and e-cigarettes, for 4 hours before the test or as told by your health care provider. If you need help quitting, ask your health care provider. What happens during  the procedure?  You will take off your clothes from the waist up and put on a hospital gown.  A technician will place electrodes on your chest.  A blood pressure cuff will be placed on your arm.  You will lie down on a table for an ultrasound exam before you exercise. Gel will be rubbed on your chest, and a handheld device (transducer) will be pressed against your chest and moved over your heart.  Then, you will start exercising by walking on a treadmill or pedaling a stationary bicycle.  Your blood pressure and heart rhythm will be monitored while you exercise.  The exercise will gradually get harder or faster.  You will exercise until:  Your heart reaches a target level.  You are too tired to continue.  You cannot continue because of chest pain, weakness, or dizziness.  You will have another ultrasound exam after you stop exercising. The procedure may vary among health care providers and hospitals. What happens after the procedure?  Your heart rate and blood pressure will be monitored until they return to your normal levels. Summary  An exercise stress echocardiogram is a test that uses ultrasound to check how well your heart works before and after exercise.  Before the test, follow instructions from your health care provider about stopping medications, avoiding nicotine and tobacco, and avoiding certain foods and drinks.  During the test, your blood pressure and heart rhythm will be monitored while you exercise on a treadmill or stationary bicycle. This information is not intended to replace advice given to you by your health care provider. Make sure you discuss any questions you have with your health care provider. Document Released: 05/31/2004 Document Revised: 01/17/2016 Document Reviewed: 01/17/2016 Elsevier Interactive Patient Education  2017 [employer].

## 2016-07-04 NOTE — Patient Instructions (Signed)
Great to see you. Please work on your low cholesterol diet.  Please stop by to see Rosaria Ferries on your way out to schedule your cardiology appointment.

## 2016-07-04 NOTE — Progress Notes (Signed)
Cardiology Office Note   Date:  07/04/2016   ID:  Beverly Cook, DOB 1965/06/25, MRN [id number]  Referring Doctor:  Arnette Norris, MD   Cardiologist:   Wende Bushy, MD   Reason for consultation:  No chief complaint on file. Referred for episodes of tachycardia palpitations with exercise    History of Present Illness: Beverly Cook is a 51 y.o. female who presents for episodes of tachycardia during exercise  Patient started going to the gym and working on the elliptical for 3 months ago. She has noticed that within the first 10 minutes, probably within the first 8 minutes, her heart rate goes up to the 170s as detected by the machine. She does not necessarily feel her heart going fast. No unusual shortness of breath. Sometimes she would develop some neck pains during this time period but no true chest pain. In the summer, she was more active playing tennis with the husband, biking. She has no chest pains or shortness of breath during these exercise.  She also reports some fluttering sensation in her chest or the last several months or so. These are randomly occurring, mild in intensity, lasting a few seconds at a time, not predictable in onset, spontaneously resolving. Not associated with chest pain or shortness breath.  She reports one episode of chest pain that woke her up from sleep, this was a few months ago. No recurrence since then. This was mild in intensity, lasting a few seconds or so. Spontaneously resolved.  Patient has no fever, cough, colds, abdominal pain. No lightheadedness or passing out.  ROS:  Please see the history of present illness. Aside from mentioned under HPI, all other systems are reviewed and negative.     Past Medical History:  Diagnosis Date  . Depression   . Lesion of rectum   . Osteopenia   . Thyroid disease    HYPOTHYRIODISM /GOITER    Past Surgical History:  Procedure Laterality Date  . ABDOMINAL HYSTERECTOMY  2009   [facility]  . AUGMENTATION  MAMMAPLASTY Bilateral 2005  . BREAST SURGERY  2004   BREAST AUGMENTATION  . COLONOSCOPY    . FLEXIBLE SIGMOIDOSCOPY N/A 06/30/2015   Procedure: FLEXIBLE SIGMOIDOSCOPY;  Surgeon: Lollie Sails, MD;  Location: [facility] ENDOSCOPY;  Service: Endoscopy;  Laterality: N/A;  . WISDOM TOOTH EXTRACTION       reports that she has been smoking Cigarettes.  She has been smoking about 0.25 packs per day. She has never used smokeless tobacco. She reports that she drinks about 0.6 oz of alcohol per week . She reports that she does not use drugs.   family history includes Breast cancer in her paternal aunt and paternal aunt; Cancer in her paternal aunt and paternal aunt; Diabetes in her maternal grandmother; Hyperlipidemia in her father; Hypertension in her father and mother.   Outpatient Medications Prior to Visit  Medication Sig Dispense Refill  . calcium citrate-vitamin D 200-200 MG-UNIT TABS Take 1 tablet by mouth daily. 600 MG  DAILY    . cyclobenzaprine (FLEXERIL) 5 MG tablet TAKE 1-2 TABS AS NEEDED AT BEDTIME FOR NECK SPASM 30 tablet 0  . Estradiol 10 MCG TABS vaginal tablet 1 tablet per vagina 3 days per week 30 tablet 12  . Vitamin D, Ergocalciferol, (DRISDOL) 50000 units CAPS capsule Take 1 capsule (50,000 Units total) by mouth every 7 (seven) days. 8 capsule 0   No facility-administered medications prior to visit.      Allergies: Patient has no  known allergies.    PHYSICAL EXAM: VS:  BP 106/62 (BP Location: Right Arm, Patient Position: Sitting, Cuff Size: Normal)   Pulse 72   Ht 5\' 4"  (1.626 m)   Wt 130 lb (59 kg)   BMI 22.31 kg/m  , Body mass index is 22.31 kg/m. Wt Readings from Last 3 Encounters:  07/04/16 130 lb (59 kg)  07/04/16 127 lb 8 oz (57.8 kg)  06/18/16 128 lb (58.1 kg)    GENERAL:  well developed, well nourished, obese, not in acute distress HEENT: normocephalic, pink conjunctivae, anicteric sclerae, no xanthelasma, normal dentition, oropharynx clear NECK:  no neck vein  engorgement, JVP normal, no hepatojugular reflux, carotid upstroke brisk and symmetric, no bruit, no thyromegaly, no lymphadenopathy LUNGS:  good respiratory effort, clear to auscultation bilaterally CV:  PMI not displaced, no thrills, no lifts, S1 and S2 within normal limits, no palpable S3 or S4, no murmurs, no rubs, no gallops ABD:  Soft, nontender, nondistended, normoactive bowel sounds, no abdominal aortic bruit, no hepatomegaly, no splenomegaly MS: nontender back, no kyphosis, no scoliosis, no joint deformities EXT:  2+ DP/PT pulses, no edema, no varicosities, no cyanosis, no clubbing SKIN: warm, nondiaphoretic, normal turgor, no ulcers NEUROPSYCH: alert, oriented to person, place, and time, sensory/motor grossly intact, normal mood, appropriate affect  Recent Labs: 07/02/2016: ALT 24; BUN 13; Creatinine, Ser 0.72; Hemoglobin 14.4; Platelets 184; Potassium 4.0; Sodium 141; TSH 0.687   Lipid Panel    Component Value Date/Time   CHOL 241 (H) 07/02/2016 0926   TRIG 103 07/02/2016 0926   HDL 68 07/02/2016 0926   CHOLHDL 3.5 07/02/2016 0926   VLDL 21 07/02/2016 0926   LDLCALC 152 (H) 07/02/2016 0926     Other studies Reviewed:  EKG:  The ekg from 07/04/2016 was personally reviewed by me and it revealed sinus rhythm, 73 BPM.  Additional studies/ records that were reviewed personally reviewed by me today include: None available   ASSESSMENT AND PLAN: Tachycardia with exertion or exercise Atypical chest pain which was one episode Best to be evaluated with a stress echocardiogram. We will be able to tell what happens with  heart rate in her blood pressure when she exercises on the treadmill. Her symptoms may be normal heart rate response with exercise. Important to rule out any arrhythmia hence the test.  Palpitations or fluttering This is not during the time of exertion Recommended hospital doing an event monitor. She would like to hold off on this first and do the stress echo  first.  Tobacco use We discussed the importance of smoking cessation and different strategies for quitting.   Current medicines are reviewed at length with the patient today.  The patient does not have concerns regarding medicines.  Labs/ tests ordered today include:  Orders Placed This Encounter  Procedures  . Cardiac event monitor  . EKG 12-Lead  . ECHOCARDIOGRAM STRESS TEST    I had a lengthy and detailed discussion with the patient regarding diagnoses, prognosis, diagnostic options, treatment options  I counseled the patient on importance of lifestyle modification including heart healthy diet, regular physical activity  , and smoking cessation.   Disposition:   FU with undersigned after tests  Thank you for this consultation. We will forwarding this consultation to referring physician.   Signed, Wende Bushy, MD  07/04/2016 5:11 PM    [place]  This note was generated in part with voice recognition software and I apologize for any typographical errors that were  not detected and corrected.

## 2016-07-04 NOTE — Progress Notes (Signed)
Subjective: Patient presents today to PUO. She also has a callus on her left foot that she would like for me to look at but does not want it trimmed. Denies any swelling or drainage or redness. No other foot pain today.  Denies any systemic complaints such as fevers, chills, nausea, vomiting. No acute changes since last appointment, and no other complaints at this time.   Objective: AAO x3, NAD DP/PT pulses palpable bilaterally, CRT less than 3 seconds Linear hyperkeratotic lesion on the left foot submetatarsal 2 area. No swelling or redness or drainage.  No other areas of tenderness bilaterally  No open lesions or pre-ulcerative lesions.  No pain with calf compression, swelling, warmth, erythema  Assessment: Callus; PUO  Plan: -All treatment options discussed with the patient including all alternatives, risks, complications.  -Discussed callus trim but she declined. Recommended pumice stone and moisturizer daily or urea cream.  -Orthotics were not fitting well. I sent them back for modifications to remove the metatarsal pad.  -Will call once orthotics come back in.  -Patient encouraged to call the office with any questions, concerns, change in symptoms.   Celesta Gentile, DPM

## 2016-07-04 NOTE — Progress Notes (Signed)
Subjective:   Patient ID: Beverly Cook, female    DOB: [birth year], 51 y.o.   MRN: [id number]  Beverly Cook is a pleasant 51 y.o. year old female who presents to clinic today with Annual Exam  on 07/04/2016  HPI:  Saw Dr Hulan Fray on 06/18/16 for gyn exam- note reviewed. Remote h/o hysterectomy. No h/o abnormal pap smears. Started on estrogen for vaginal dryness and Vit D.   Mammogram 07/02/16  Flex sig 06/30/15   HLD- new- increased significantly since last year. Wt Readings from Last 3 Encounters:  07/04/16 127 lb 8 oz (57.8 kg)  06/18/16 128 lb (58.1 kg)  07/04/15 123 lb 4 oz (55.9 kg)   Heart flutters- when she gets on exercise machines- heart rate climbs in 180s and she feels it is racing, has had chest pain with this. No nausea but has felt dizzy once when it happened.  Lab Results  Component Value Date   CHOL 241 (H) 07/02/2016   HDL 68 07/02/2016   LDLCALC 152 (H) 07/02/2016   TRIG 103 07/02/2016   CHOLHDL 3.5 07/02/2016   Lab Results  Component Value Date   ALT 24 07/02/2016   AST 25 07/02/2016   ALKPHOS 61 07/02/2016   BILITOT 1.2 07/02/2016   Lab Results  Component Value Date   WBC 5.0 07/02/2016   HGB 14.4 07/02/2016   HCT 40.4 07/02/2016   MCV 84.2 07/02/2016   PLT 184 07/02/2016   Lab Results  Component Value Date   TSH 0.687 07/02/2016   Current Outpatient Prescriptions on File Prior to Visit  Medication Sig Dispense Refill  . calcium citrate-vitamin D 200-200 MG-UNIT TABS Take 1 tablet by mouth daily. 600 MG  DAILY    . cyclobenzaprine (FLEXERIL) 5 MG tablet TAKE 1-2 TABS AS NEEDED AT BEDTIME FOR NECK SPASM 30 tablet 0  . Estradiol 10 MCG TABS vaginal tablet 1 tablet per vagina 3 days per week 30 tablet 12  . Vitamin D, Ergocalciferol, (DRISDOL) 50000 units CAPS capsule Take 1 capsule (50,000 Units total) by mouth every 7 (seven) days. 8 capsule 0   No current facility-administered medications on file prior to visit.     No Known  Allergies  Past Medical History:  Diagnosis Date  . Depression   . Lesion of rectum   . Osteopenia   . Thyroid disease    HYPOTHYRIODISM /GOITER    Past Surgical History:  Procedure Laterality Date  . ABDOMINAL HYSTERECTOMY  2009   T [facility]  . AUGMENTATION MAMMAPLASTY Bilateral 2005  . BREAST SURGERY  2004   BREAST AUGMENTATION  . COLONOSCOPY    . FLEXIBLE SIGMOIDOSCOPY N/A 06/30/2015   Procedure: FLEXIBLE SIGMOIDOSCOPY;  Surgeon: Lollie Sails, MD;  Location: [facility] ENDOSCOPY;  Service: Endoscopy;  Laterality: N/A;  . WISDOM TOOTH EXTRACTION      Family History  Problem Relation Age of Onset  . Hypertension Mother   . Hypertension Father   . Hyperlipidemia Father   . Cancer Paternal Aunt     BREAST  . Breast cancer Paternal Aunt   . Diabetes Maternal Grandmother   . Cancer Paternal Aunt     BREAST  . Breast cancer Paternal Aunt     Social History   Social History  . Marital status: Married    Spouse name: N/A  . Number of children: N/A  . Years of education: N/A   Occupational History  . Not on file.   Social History Main Topics  .  Smoking status: Current Every Day Smoker    Packs/day: 0.25    Types: Cigarettes  . Smokeless tobacco: Never Used  . Alcohol use 0.6 oz/week    1 Glasses of wine per week  . Drug use: No  . Sexual activity: Yes    Partners: Male    Birth control/ protection: Other-see comments, Surgical   Other Topics Concern  . Not on file   Social History Narrative  . No narrative on file   The PMH, PSH, Social History, Family History, Medications, and allergies have been reviewed in [facility], and have been updated if relevant.   Review of Systems  Constitutional: Negative.   HENT: Negative.   Eyes: Negative.   Respiratory: Negative.   Cardiovascular: Positive for chest pain and palpitations. Negative for leg swelling.  Endocrine: Negative.   Genitourinary: Negative.   Musculoskeletal: Positive for back pain.  Skin: Negative.    Allergic/Immunologic: Negative.   Neurological: Negative.   Hematological: Negative.   Psychiatric/Behavioral: Negative.   All other systems reviewed and are negative.      Objective:    BP 112/64   Pulse 64   Temp 97.9 F (36.6 C) (Oral)   Ht 5\' 4"  (1.626 m)   Wt 127 lb 8 oz (57.8 kg)   SpO2 98%   BMI 21.89 kg/m    Physical Exam   General:  Well-developed,well-nourished,in no acute distress; alert,appropriate and cooperative throughout examination Head:  normocephalic and atraumatic.   Eyes:  vision grossly intact, PERRL Ears:  R ear normal and L ear normal externally, TMs clear bilaterally Nose:  no external deformity.   Mouth:  good dentition.   Neck:  No deformities, masses, or tenderness noted. Lungs:  Normal respiratory effort, chest expands symmetrically. Lungs are clear to auscultation, no crackles or wheezes. Heart:  Normal rate and regular rhythm. S1 and S2 normal without gallop, murmur, click, rub or other extra sounds. Abdomen:  Bowel sounds positive,abdomen soft and non-tender without masses, organomegaly or hernias noted.d Msk:  No deformity or scoliosis noted of thoracic or lumbar spine.   Extremities:  No clubbing, cyanosis, edema, or deformity noted with normal full range of motion of all joints.   Neurologic:  alert & oriented X3 and gait normal.   Skin:  Intact without suspicious lesions or rashes Cervical Nodes:  No lymphadenopathy noted Axillary Nodes:  No palpable lymphadenopathy Psych:  Cognition and judgment appear intact. Alert and cooperative with normal attention span and concentration. No apparent delusions, illusions, hallucinations       Assessment & Plan:   Routine general medical examination at a health care facility  Surgical menopause  Hyperlipidemia, unspecified hyperlipidemia type No Follow-up on file.

## 2016-07-04 NOTE — Progress Notes (Signed)
Pre visit review using our clinic review tool, if applicable. No additional management support is needed unless otherwise documented below in the visit note. 

## 2016-07-04 NOTE — Assessment & Plan Note (Signed)
Reviewed preventive care protocols, scheduled due services, and updated immunizations Discussed nutrition, exercise, diet, and healthy lifestyle.  

## 2016-07-04 NOTE — Assessment & Plan Note (Signed)
Refer to cardiology for stress test. The patient indicates understanding of these issues and agrees with the plan.

## 2016-07-04 NOTE — Patient Instructions (Signed)

## 2016-07-04 NOTE — Assessment & Plan Note (Signed)
New- discussed dietary changes. Handout given.

## 2016-07-17 ENCOUNTER — Encounter: Payer: Self-pay | Admitting: *Deleted

## 2016-07-30 ENCOUNTER — Ambulatory Visit (INDEPENDENT_AMBULATORY_CARE_PROVIDER_SITE_OTHER): Payer: Federal, State, Local not specified - PPO

## 2016-07-30 DIAGNOSIS — R000 Tachycardia, unspecified: Secondary | ICD-10-CM | POA: Diagnosis not present

## 2016-07-30 LAB — ECHOCARDIOGRAM STRESS TEST
CHL CUP RESTING HR STRESS: 96 {beats}/min
CSEPEDS: 54 s
CSEPHR: 106 %
CSEPPHR: 179 {beats}/min
Estimated workload: 10.1 METS
Exercise duration (min): 8 min
MPHR: 168 {beats}/min

## 2016-08-13 ENCOUNTER — Other Ambulatory Visit: Payer: Self-pay | Admitting: Obstetrics & Gynecology

## 2016-08-13 DIAGNOSIS — R7989 Other specified abnormal findings of blood chemistry: Secondary | ICD-10-CM

## 2016-08-27 ENCOUNTER — Telehealth: Payer: Self-pay | Admitting: *Deleted

## 2016-08-27 NOTE — Telephone Encounter (Signed)
Pt states she has had her orthotics for about 1 month and is still experiencing pain, feels they are too high. I told pt we liked for pt to be seen at about 1 month if whether the orthotics are comfortable or not and and sooner if they are not and transferred pt to schedulers.

## 2016-08-29 ENCOUNTER — Ambulatory Visit: Payer: Federal, State, Local not specified - PPO

## 2016-09-04 ENCOUNTER — Encounter: Payer: Self-pay | Admitting: *Deleted

## 2016-09-05 ENCOUNTER — Encounter
Admission: RE | Disposition: A | Discharge status: Home / Self Care | Payer: Self-pay | Source: Ambulatory Visit | Attending: Gastroenterology

## 2016-09-05 ENCOUNTER — Encounter: Payer: Self-pay | Admitting: *Deleted

## 2016-09-05 ENCOUNTER — Ambulatory Visit
Admission: RE | Admit: 2016-09-05 | Discharge: 2016-09-05 | Disposition: A | Discharge status: Home / Self Care | Payer: Federal, State, Local not specified - PPO | Source: Ambulatory Visit | Attending: Gastroenterology | Admitting: Gastroenterology

## 2016-09-05 DIAGNOSIS — Z09 Encounter for follow-up examination after completed treatment for conditions other than malignant neoplasm: Secondary | ICD-10-CM | POA: Insufficient documentation

## 2016-09-05 DIAGNOSIS — Z79890 Hormone replacement therapy (postmenopausal): Secondary | ICD-10-CM | POA: Diagnosis not present

## 2016-09-05 DIAGNOSIS — Z79899 Other long term (current) drug therapy: Secondary | ICD-10-CM | POA: Insufficient documentation

## 2016-09-05 DIAGNOSIS — R85613 High grade squamous intraepithelial lesion on cytologic smear of anus (HGSIL): Secondary | ICD-10-CM | POA: Diagnosis not present

## 2016-09-05 DIAGNOSIS — K6282 Dysplasia of anus: Secondary | ICD-10-CM | POA: Diagnosis not present

## 2016-09-05 HISTORY — DX: Thyrotoxicosis, unspecified without thyrotoxic crisis or storm: E05.90

## 2016-09-05 HISTORY — PX: FLEXIBLE SIGMOIDOSCOPY: SHX5431

## 2016-09-05 HISTORY — DX: Diverticulosis of intestine, part unspecified, without perforation or abscess without bleeding: K57.90

## 2016-09-05 SURGERY — SIGMOIDOSCOPY, FLEXIBLE
Anesthesia: Moderate Sedation

## 2016-09-05 MED ORDER — LIDOCAINE HCL 2 % EX GEL
CUTANEOUS | Status: AC
  Filled 2016-09-05: qty 5

## 2016-09-05 MED ORDER — SODIUM CHLORIDE 0.9 % IV SOLN: INTRAVENOUS | Status: DC

## 2016-09-05 NOTE — Op Note (Signed)
[facility] Gastroenterology Patient Name: Beverly Cook Procedure Date: 09/05/2016 11:31 AM MRN: [id number] Account #: [id number] Date of Birth: Apr 29, 1966 Admit Type: Outpatient Age: 51 Room: [facility] ENDO ROOM 1 Gender: Female Note Status: Finalized Procedure:            Flexible Sigmoidoscopy Indications:          follow up of squamous intraepithelial neoplasm of the                        anal canal Providers:            Lollie Sails, MD Referring MD:         Marciano Sequin. Deborra Medina (Referring MD) Medicines:            None Complications:        No immediate complications. Procedure:            Pre-Anesthesia Assessment:                       - ASA Grade Assessment: II - A patient with mild                        systemic disease.                       After obtaining informed consent, the scope was passed                        under direct vision. The Colonoscope was introduced                        through the anus and advanced to the the sigmoid colon.                        The flexible sigmoidoscopy was accomplished without                        difficulty. The patient tolerated the procedure well.                        The quality of the bowel preparation was good. The                        Endoscope was introduced through the anus and advanced                        to the the rectosigmoid junction. The patient tolerated                        the procedure well. The quality of the bowel                        preparation was good. Findings:      The digital rectal exam was normal.      The rectum and recto-sigmoid colon appeared normal. I was unable to       adequately retroflex the colonoscope and I switched to a EGD scope. This       was easily retroflexed, showing a normal anal ring. Careful evaluation       of the anal canal showed no evidence of  the previously seen lesion. Impression:           - The rectum and recto-sigmoid colon are normal.                     - No specimens collected. Recommendation:       repeat anoscopy in about 2 years.                       - Repeat flexible sigmoidoscopy in 2 years for                        surveillance. Procedure Code(s):    --- Professional ---                       [phone], 52, Sigmoidoscopy, flexible; diagnostic,                        including collection of specimen(s) by brushing or                        washing, when performed (separate procedure) CPT copyright 2016 American Medical Association. All rights reserved. The codes documented in this report are preliminary and upon coder review may  be revised to meet current compliance requirements. Lollie Sails, MD 09/05/2016 12:06:04 PM This report has been signed electronically. Number of Addenda: 0 Note Initiated On: 09/05/2016 11:31 AM Total Procedure Duration: 0 hours 8 minutes 38 seconds       [facility]

## 2016-09-05 NOTE — H&P (Signed)
Outpatient short stay form Pre-procedure 09/05/2016 11:29 AM Lollie Sails MD  Primary Physician: Dr. Stefano Gaul  Reason for visit:  Anoscopy/flexible sigmoidoscopy  History of present illness:  Patient is a 51 year old female with a history of high-grade intra-epithelial squamous lesion of the transitional mucosa the anal canal. He has undergone local treatment with surface medications via dermatology. She is presenting today for recheck of this area.  She takes no blood thinning agents or aspirin products.    Current Facility-Administered Medications:  .  0.9 %  sodium chloride infusion, , Intravenous, Continuous, Lollie Sails, MD .  0.9 %  sodium chloride infusion, , Intravenous, Continuous, Lollie Sails, MD  Prescriptions Prior to Admission  Medication Sig Dispense Refill Last Dose  . calcium citrate-vitamin D 200-200 MG-UNIT TABS Take 1 tablet by mouth daily. 600 MG  DAILY   09/04/2016 at Unknown time  . cyclobenzaprine (FLEXERIL) 5 MG tablet TAKE 1-2 TABS AS NEEDED AT BEDTIME FOR NECK SPASM 30 tablet 0 09/04/2016 at Unknown time  . Estradiol 10 MCG TABS vaginal tablet 1 tablet per vagina 3 days per week 30 tablet 12 09/04/2016 at Unknown time  . Vitamin D, Ergocalciferol, (DRISDOL) 50000 units CAPS capsule Take 1 capsule (50,000 Units total) by mouth every 7 (seven) days. 8 capsule 0 09/04/2016 at Unknown time  . meloxicam (MOBIC) 15 MG tablet Take 15 mg by mouth as needed for pain.   Not Taking at Unknown time     No Known Allergies   Past Medical History:  Diagnosis Date  . Depression   . Diverticulosis   . Hyperthyroidism   . Lesion of rectum   . Osteopenia   . Thyroid disease    HYPOTHYRIODISM /GOITER    Review of systems:      Physical Exam    Heart and lungs: Regular rate and rhythm without rub or gallop lungs bilaterally clear    HEENT: Normocephalic atraumatic eyes are anicteric    Other: Soft nontender nondistended bowel sounds positive  normoactive.    Pertinant exam for procedure: Soft nontender nondistended bowel sounds positive normoactive  Planned proceedures:  Anoscopy, flexible sigmoidoscopy with indicated procedures. I have discussed the risks benefits and complications of procedures to include not limited to bleeding, infection, perforation and the risk of sedation and the patient wishes to proceed.     Lollie Sails, MD Gastroenterology 09/05/2016  11:29 AM

## 2016-09-06 ENCOUNTER — Encounter: Payer: Self-pay | Admitting: Gastroenterology

## 2016-10-07 DIAGNOSIS — K080 Exfoliation of teeth due to systemic causes: Secondary | ICD-10-CM | POA: Diagnosis not present

## 2017-01-29 ENCOUNTER — Ambulatory Visit: Payer: Federal, State, Local not specified - PPO | Admitting: Pain Medicine

## 2017-01-29 ENCOUNTER — Encounter: Payer: Self-pay | Admitting: Family Medicine

## 2017-02-18 ENCOUNTER — Ambulatory Visit: Payer: Federal, State, Local not specified - PPO | Admitting: Pain Medicine

## 2017-02-28 DIAGNOSIS — M4726 Other spondylosis with radiculopathy, lumbar region: Secondary | ICD-10-CM | POA: Diagnosis not present

## 2017-02-28 DIAGNOSIS — M545 Low back pain: Secondary | ICD-10-CM | POA: Diagnosis not present

## 2017-02-28 DIAGNOSIS — M549 Dorsalgia, unspecified: Secondary | ICD-10-CM | POA: Diagnosis not present

## 2017-03-04 ENCOUNTER — Telehealth: Payer: Self-pay | Admitting: Cardiology

## 2017-03-04 ENCOUNTER — Other Ambulatory Visit: Payer: Self-pay | Admitting: Orthopaedic Surgery

## 2017-03-04 DIAGNOSIS — M545 Low back pain: Secondary | ICD-10-CM

## 2017-03-04 DIAGNOSIS — M4726 Other spondylosis with radiculopathy, lumbar region: Secondary | ICD-10-CM

## 2017-03-04 NOTE — Telephone Encounter (Signed)
S/w patient. Patient said the chest pain she had yesterday was "6" out of 10. It lasted about 15 minutes along with tightness in her jaw. Denies having shortness of breath, n/v, dizziness or sweating. It has not happened since yesterday. Offered patient an appointment but she declined. She prefers to call us if it were to happen again. Pt verbalized understanding to call 911 or go to the emergency room, if he develops any new or worsening symptoms.

## 2017-03-04 NOTE — Telephone Encounter (Signed)
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  Today she is not having any symptoms   Would like some advise on what happened yesterday

## 2017-03-25 ENCOUNTER — Ambulatory Visit
Admission: RE | Admit: 2017-03-25 | Discharge: 2017-03-25 | Disposition: A | Discharge status: Home / Self Care | Payer: Federal, State, Local not specified - PPO | Source: Ambulatory Visit | Attending: Orthopaedic Surgery | Admitting: Orthopaedic Surgery

## 2017-03-25 DIAGNOSIS — M5136 Other intervertebral disc degeneration, lumbar region: Secondary | ICD-10-CM | POA: Insufficient documentation

## 2017-03-25 DIAGNOSIS — M5137 Other intervertebral disc degeneration, lumbosacral region: Secondary | ICD-10-CM | POA: Insufficient documentation

## 2017-03-25 DIAGNOSIS — M549 Dorsalgia, unspecified: Secondary | ICD-10-CM | POA: Diagnosis not present

## 2017-03-25 DIAGNOSIS — M47816 Spondylosis without myelopathy or radiculopathy, lumbar region: Secondary | ICD-10-CM | POA: Diagnosis not present

## 2017-03-25 DIAGNOSIS — M545 Low back pain: Secondary | ICD-10-CM

## 2017-03-25 DIAGNOSIS — M4726 Other spondylosis with radiculopathy, lumbar region: Secondary | ICD-10-CM | POA: Diagnosis not present

## 2017-03-26 ENCOUNTER — Encounter: Payer: Self-pay | Admitting: Podiatry

## 2017-03-26 ENCOUNTER — Ambulatory Visit (INDEPENDENT_AMBULATORY_CARE_PROVIDER_SITE_OTHER): Payer: Federal, State, Local not specified - PPO | Admitting: Podiatry

## 2017-03-26 DIAGNOSIS — M722 Plantar fascial fibromatosis: Secondary | ICD-10-CM | POA: Diagnosis not present

## 2017-03-26 DIAGNOSIS — M79672 Pain in left foot: Secondary | ICD-10-CM | POA: Diagnosis not present

## 2017-03-26 NOTE — Progress Notes (Signed)
She presents today for follow-up of her chronic plantar fasciitis. She states that been dealing with this for over a year and is not any better. She states this matter of fact the orthotics may have made my heels worse because they hurt now in the mornings. She still states that she gets burning in the forefoot.  Objective: Vital signs are stable she is alert and oriented 3. Pulses are palpable. Neurologic sensorium is intact. Degenerative flexor intact. Muscle strength is normal symmetrical bilateral. She has tight gastrocs bilaterally left greater than right. She also has pain on palpation medially continue tubercle of the left heel greater than that of the right.  Assessment: Plantar fasciitis chronic in nature left heel.  Plan: We will send her for an MRI to consider surgical intervention. We did discuss the possible need for a gastroc recession as well. Follow-up with her once the MRI has returned.

## 2017-03-27 NOTE — Addendum Note (Signed)
Addended by: Graceann Congress D on: 03/27/2017 11:30 AM   Modules accepted: Orders

## 2017-03-28 ENCOUNTER — Telehealth: Payer: Self-pay | Admitting: *Deleted

## 2017-03-28 NOTE — Telephone Encounter (Signed)
[facility] states she needs order for pt's left heel MRI, faxed to [phone]. Faxed MRI ordered from [facility] office to [facility].

## 2017-04-04 ENCOUNTER — Ambulatory Visit
Admission: RE | Admit: 2017-04-04 | Discharge: 2017-04-04 | Disposition: A | Discharge status: Home / Self Care | Payer: Federal, State, Local not specified - PPO | Source: Ambulatory Visit | Attending: Podiatry | Admitting: Podiatry

## 2017-04-04 DIAGNOSIS — M79672 Pain in left foot: Secondary | ICD-10-CM

## 2017-04-04 DIAGNOSIS — M79662 Pain in left lower leg: Secondary | ICD-10-CM | POA: Diagnosis not present

## 2017-04-07 ENCOUNTER — Encounter: Payer: Self-pay | Admitting: Podiatry

## 2017-04-07 ENCOUNTER — Telehealth: Payer: Self-pay | Admitting: *Deleted

## 2017-04-07 DIAGNOSIS — K080 Exfoliation of teeth due to systemic causes: Secondary | ICD-10-CM | POA: Diagnosis not present

## 2017-04-07 NOTE — Telephone Encounter (Signed)
Faxed request for MRI disc copy. Left message informing pt of Dr. Stephenie Acres request to send copy of MRI disc to a radiology specialist who would review the minute details of the MRI disc, for more information for Dr. Milinda Pointer to plan treatment.

## 2017-04-07 NOTE — Telephone Encounter (Signed)
-----   Message from Garrel Ridgel, [place] sent at 04/05/2017  7:54 AM EDT ----- This was absolutely no help. Please send for an over read and inform patient of the delivery.

## 2017-04-11 DIAGNOSIS — H524 Presbyopia: Secondary | ICD-10-CM | POA: Diagnosis not present

## 2017-04-11 NOTE — Telephone Encounter (Signed)
Mailed copy of MRI disc to [facility]. 

## 2017-04-17 ENCOUNTER — Encounter: Payer: Self-pay | Admitting: Podiatry

## 2017-04-28 ENCOUNTER — Telehealth: Payer: Self-pay | Admitting: Podiatry

## 2017-04-29 NOTE — Telephone Encounter (Signed)
Dr. Milinda Pointer had put a scanned document in under MyChart. I was wondering if you could release that so I can view it or could you e-mail it to me. You can reach me at [phone]. Thank you.

## 2017-05-08 ENCOUNTER — Encounter: Payer: Self-pay | Admitting: Radiology

## 2017-05-08 ENCOUNTER — Ambulatory Visit: Payer: Federal, State, Local not specified - PPO | Admitting: Podiatry

## 2017-05-15 ENCOUNTER — Encounter: Payer: Self-pay | Admitting: Podiatry

## 2017-05-15 ENCOUNTER — Ambulatory Visit (INDEPENDENT_AMBULATORY_CARE_PROVIDER_SITE_OTHER): Payer: Federal, State, Local not specified - PPO | Admitting: Podiatry

## 2017-05-15 DIAGNOSIS — M722 Plantar fascial fibromatosis: Secondary | ICD-10-CM | POA: Diagnosis not present

## 2017-05-15 MED ORDER — MELOXICAM 15 MG PO TABS: 15.0000 mg | ORAL_TABLET | Freq: Every day | ORAL | 3 refills | Status: DC

## 2017-05-15 MED ORDER — AMITRIPTYLINE HCL 25 MG PO TABS: 25.0000 mg | ORAL_TABLET | Freq: Every day | ORAL | 3 refills | Status: DC

## 2017-05-15 NOTE — Progress Notes (Signed)
She presents today for follow-up of her plantar fasciitis and Achilles tendinitis.  She states that the plantar fascia and the Achilles tendon does not bother her she states that she is only having pain and burning in the entire plantar aspect of the foot after she has been on her feet for a while.  She does relate a history of back trauma but the pain and burning that she gets in her feet after being on her feet for several hours is bilaterally symmetrical.  She states that her feet turn red on the bottom and are blazing hot.  She states that it feels like fire pokers poking all over the plantar aspect of her foot.  She states that she can be on her feet until around noon before they start to burn in her shoes.  Objective: Vital signs are stable she is alert and oriented x3 no reproducible pain.  MRI does demonstrate plantar fasciitis Achilles tendinitis.  Assessment: Burning foot syndrome history of plantar fasciitis Achilles tendinitis.  Plan: At this point she does not recognize the plantar fasciitis and Achilles tendinitis can cause what she is healing she states that is just a burning pain and that she does not have pain from the plantar fascia or the Achilles in general.  Only time it ever bothers her is first thing in the morning.  So at this point were going to consider amitriptyline for the burning frequency will start taking at 10:00 25 mg.  I will follow-up with her in 1 month to see how this is doing.  May need to consider gabapentin.

## 2017-06-12 ENCOUNTER — Telehealth: Payer: Self-pay | Admitting: *Deleted

## 2017-06-12 MED ORDER — AMITRIPTYLINE HCL 25 MG PO TABS: 25.0000 mg | ORAL_TABLET | Freq: Every day | ORAL | 1 refills | Status: DC

## 2017-06-12 NOTE — Telephone Encounter (Signed)
Request for 90 day supply of amitriptyline 25mg . Dr. Marta Antu fill as 90 day supply +1refill.

## 2017-06-16 ENCOUNTER — Other Ambulatory Visit (HOSPITAL_COMMUNITY): Payer: Self-pay | Admitting: Obstetrics & Gynecology

## 2017-06-16 DIAGNOSIS — Z1231 Encounter for screening mammogram for malignant neoplasm of breast: Secondary | ICD-10-CM

## 2017-07-09 ENCOUNTER — Ambulatory Visit: Payer: Federal, State, Local not specified - PPO | Admitting: Cardiovascular Disease

## 2017-07-09 ENCOUNTER — Telehealth: Payer: Self-pay | Admitting: Cardiovascular Disease

## 2017-07-09 ENCOUNTER — Encounter: Payer: Self-pay | Admitting: Cardiovascular Disease

## 2017-07-09 VITALS — BP 122/64 | HR 66 | Ht 65.0 in | Wt 132.5 lb

## 2017-07-09 DIAGNOSIS — E782 Mixed hyperlipidemia: Secondary | ICD-10-CM

## 2017-07-09 DIAGNOSIS — Z720 Tobacco use: Secondary | ICD-10-CM | POA: Diagnosis not present

## 2017-07-09 DIAGNOSIS — R079 Chest pain, unspecified: Secondary | ICD-10-CM

## 2017-07-09 DIAGNOSIS — R000 Tachycardia, unspecified: Secondary | ICD-10-CM | POA: Diagnosis not present

## 2017-07-09 NOTE — Patient Instructions (Addendum)
Medication Instructions:   No medication changes made  Labwork:  No new labs needed  Testing/Procedures:  We will order a CT coronary calcium score for chest pain, family hx $150   Follow-Up: It was a pleasure seeing you in the office today. Please call us if you have new issues that need to be addressed before your next appt.  [phone]  Your physician wants you to follow-up in:  As needed  If you need a refill on your cardiac medications before your next appointment, please call your pharmacy.

## 2017-07-09 NOTE — Progress Notes (Signed)
Cardiology Office Note  Date:  07/09/2017   ID:  Beverly Cook, DOB [birth year], MRN [id number]  PCP:  Lucille Passy, MD   Chief Complaint  Patient presents with  . Other    Patient c/o Chest pain and SOB this morning. Former ingal patient. Meds reviewed verbally with patient.     HPI:  Beverly Cook is a 52 y.o. female  Smoker Hyperlipidemia Previous episodes of palpitations Normal stress echocardiogram 2018 Previous episode of chest pain in 2018 that woke her from sleep  Presents for routine evaluation for symptoms of chest pain  Reports having Chest pain, few months ago Lasting for 15 min, central chest EKG was normal (did through work)  Otherwise has been exercising on elliptical sometimes heart rate up to 150 even 180 bpm  Developed chest pain this AM, at rest Central chest no radiation Again lasting for 15 min Seemed to resolve without intervention  Currently pain-free, feels back to her baseline  EKG personally reviewed by myself on todays visit Shows normal sinus rhythm rate 66 bpm no significant ST or T wave changes   PMH:   has a past medical history of Depression, Diverticulosis, Hyperthyroidism, Lesion of rectum, Osteopenia, and Thyroid disease.  PSH:    Past Surgical History:  Procedure Laterality Date  . ABDOMINAL HYSTERECTOMY  2009   T [facility]  . AUGMENTATION MAMMAPLASTY Bilateral 2005  . BILATERAL SALPINGOOPHORECTOMY    . BREAST SURGERY  2004   BREAST AUGMENTATION  . COLONOSCOPY    . FLEXIBLE SIGMOIDOSCOPY N/A 06/30/2015   Procedure: FLEXIBLE SIGMOIDOSCOPY;  Surgeon: Lollie Sails, MD;  Location: [facility] ENDOSCOPY;  Service: Endoscopy;  Laterality: N/A;  . FLEXIBLE SIGMOIDOSCOPY N/A 09/05/2016   Procedure: FLEXIBLE SIGMOIDOSCOPY;  Surgeon: Lollie Sails, MD;  Location: [facility] ENDOSCOPY;  Service: Endoscopy;  Laterality: N/A;  . WISDOM TOOTH EXTRACTION      Current Outpatient Medications  Medication Sig Dispense Refill  . amitriptyline (ELAVIL)  25 MG tablet Take 1 tablet (25 mg total) by mouth at bedtime. 90 tablet 1  . calcium citrate-vitamin D 200-200 MG-UNIT TABS Take 1 tablet by mouth daily. 600 MG  DAILY    . cyclobenzaprine (FLEXERIL) 5 MG tablet TAKE 1-2 TABS AS NEEDED AT BEDTIME FOR NECK SPASM 30 tablet 0  . meloxicam (MOBIC) 15 MG tablet Take 1 tablet (15 mg total) by mouth daily. 90 tablet 3   No current facility-administered medications for this visit.      Allergies:   Patient has no known allergies.   Social History:  The patient  reports that she has been smoking cigarettes.  She has been smoking about 0.25 packs per day. she has never used smokeless tobacco. She reports that she drinks about 0.6 oz of alcohol per week. She reports that she does not use drugs.   Family History:   family history includes Breast cancer in her cousin, paternal aunt, and paternal aunt; Cancer in her paternal aunt and paternal aunt; Diabetes in her maternal grandmother; Hyperlipidemia in her father; Hypertension in her father and mother.    Review of Systems: Review of Systems  Constitutional: Negative.   Respiratory: Negative.   Cardiovascular: Negative.   Gastrointestinal: Negative.   Musculoskeletal: Negative.   Neurological: Negative.   Psychiatric/Behavioral: Negative.   All other systems reviewed and are negative.    PHYSICAL EXAM: VS:  BP 122/64 (BP Location: Left Arm, Patient Position: Sitting, Cuff Size: Normal)   Pulse 66   Ht 5\' 5"  (1.651  m)   Wt 132 lb 8 oz (60.1 kg)   BMI 22.05 kg/m  , BMI Body mass index is 22.05 kg/m. GEN: Well nourished, well developed, in no acute distress  HEENT: normal  Neck: no JVD, carotid bruits, or masses Cardiac: RRR; no murmurs, rubs, or gallops,no edema  Respiratory:  clear to auscultation bilaterally, normal work of breathing GI: soft, nontender, nondistended, + BS MS: no deformity or atrophy  Skin: warm and dry, no rash Neuro:  Strength and sensation are intact  Psych:  euthymic mood, full affect    Recent Labs: No results found for requested labs within last 8760 hours.    Lipid Panel Lab Results  Component Value Date   CHOL 241 (H) 07/02/2016   HDL 68 07/02/2016   LDLCALC 152 (H) 07/02/2016   TRIG 103 07/02/2016      Wt Readings from Last 3 Encounters:  07/09/17 132 lb 8 oz (60.1 kg)  09/05/16 130 lb (59 kg)  07/04/16 130 lb (59 kg)       ASSESSMENT AND PLAN:  Chest pain with moderate risk for cardiac etiology Prior history of chest pain, atypical in nature 2 episodes recently not associated with exertion Hyperlipidemia, smoking history as well as family history Good exercise tolerance For risk stratification we have recommended CT coronary calcium scoring  Tobacco user Reports that she smokes several cigarettes per day Cessation recommended  Mixed hyperlipidemia Cholesterol 240, scan as above to help guide lipid management  Exercise-induced tachycardia Denies having significant symptoms of tachycardia if symptoms recur we could order a event monitor   Total encounter time more than 25 minutes  Greater than 50% was spent in counseling and coordination of care with the patient   Disposition:   F/U as needed   Orders Placed This Encounter  Procedures  . CT CARDIAC SCORING  . EKG 12-Lead     Signed, Esmond Plants, M.D., Ph.D. 07/09/2017  [place], [place]

## 2017-07-09 NOTE — Telephone Encounter (Signed)
Pt c/o of Chest Pain: STAT if CP now or developed within 24 hours  1. Are you having CP right now? They just went away   2. Are you experiencing any other symptoms (ex. SOB, nausea, vomiting, sweating)?  Just the pains  3. How long have you been experiencing CP?  15 min  4. Is your CP continuous or coming and going?  constraint sharpe pains  5. Have you taken Nitroglycerin?  No

## 2017-07-09 NOTE — Telephone Encounter (Signed)
I spoke with the patient. She reports an episode of sharp chest pain that occurred this morning while she was sitting. This lasted about 15 minutes with no associated symptoms.  She works upstairs in [employer]- during that time her BP was 123/60, HR- 72, O2 sat- 100%. She stated the pain felt a little better when she pressed on her chest. She had similar symptoms a few months ago that lasted about the same amount of time.  She was offered evaluation of her symptoms in the office at that time, but she declined.  She previously saw Dr. Yvone Neu for tachycardia/ palpitations, but this has not been an issue for her lately. She had a "low risk" stress echo in January 2018. I advised the patient that I do not think her symptoms sound urgent, but did offer her evaluation in the office and she did want this. She is scheduled to come in to see Dr. Rockey Situ today at 3:20 pm.  The patient is agreeable.

## 2017-07-17 ENCOUNTER — Ambulatory Visit (INDEPENDENT_AMBULATORY_CARE_PROVIDER_SITE_OTHER)
Admission: RE | Admit: 2017-07-17 | Discharge: 2017-07-17 | Disposition: A | Discharge status: Home / Self Care | Payer: Self-pay | Source: Ambulatory Visit | Attending: Cardiovascular Disease | Admitting: Cardiovascular Disease

## 2017-07-17 ENCOUNTER — Ambulatory Visit (INDEPENDENT_AMBULATORY_CARE_PROVIDER_SITE_OTHER): Payer: Federal, State, Local not specified - PPO | Admitting: Obstetrics & Gynecology

## 2017-07-17 ENCOUNTER — Encounter: Payer: Self-pay | Admitting: Obstetrics & Gynecology

## 2017-07-17 VITALS — BP 103/65 | HR 81 | Ht 65.0 in | Wt 129.0 lb

## 2017-07-17 DIAGNOSIS — Z01419 Encounter for gynecological examination (general) (routine) without abnormal findings: Secondary | ICD-10-CM

## 2017-07-17 DIAGNOSIS — R079 Chest pain, unspecified: Secondary | ICD-10-CM

## 2017-07-17 NOTE — Progress Notes (Signed)
Subjective:    Beverly Cook is a 52 y.o. married P2 female who presents for an annual exam. She still has vaginal dryness and decreased libido. She tried vaginal estrogen and testosterone in the past but had clitoral hypertrophy.  The patient is sexually active. GYN screening history: last pap: was normal. The patient wears seatbelts: yes. The patient participates in regular exercise: yes. Has the patient ever been transfused or tattooed?: no. The patient reports that there is not domestic violence in her life.   Menstrual History: OB History    Gravida Para Term Preterm AB Living   _0 SAB TAB Ectopic Multiple Live Births                  Menarche age: 36 No LMP recorded. Patient has had a hysterectomy.    The following portions of the patient's history were reviewed and updated as appropriate: allergies, current medications, past family history, past medical history, past social history, past surgical history and problem list.  Review of Systems Pertinent items are noted in HPI.   She is up to date on her colonoscopy. A rectal lesion was found at age 23 and she has had a normal colonoscopy in 2018. Married FH- + breast cancer in 3 paternal aunts and 2 paternal cousins She is BRCA negative   Objective:    BP 103/65   Pulse 81   Ht _1  (1.651 m)   Wt 129 lb (58.5 kg)   BMI 21.47 kg/m   General Appearance:    Alert, cooperative, no distress, appears stated age  Head:    Normocephalic, without obvious abnormality, atraumatic  Eyes:    PERRL, conjunctiva/corneas clear, EOM's intact, fundi    benign, both eyes  Ears:    Normal TM's and external ear canals, both ears  Nose:   Nares normal, septum midline, mucosa normal, no drainage    or sinus tenderness  Throat:   Lips, mucosa, and tongue normal; teeth and gums normal  Neck:   Supple, symmetrical, trachea midline, no adenopathy;    thyroid:  no enlargement/tenderness/nodules; no carotid   bruit or JVD  Back:      Symmetric, no curvature, ROM normal, no CVA tenderness  Lungs:     Clear to auscultation bilaterally, respirations unlabored  Chest Wall:    No tenderness or deformity   Heart:    Regular rate and rhythm, S1 and S2 normal, no murmur, rub   or gallop  Breast Exam:    No tenderness, masses, or nipple abnormality  Abdomen:     Soft, non-tender, bowel sounds active all four quadrants,    no masses, no organomegaly  Genitalia:    Normal female without lesion, discharge or tenderness, moderate vulvovaginal atrophy, normal vagina, cuff, and bimanual exam     Extremities:   Extremities normal, atraumatic, no cyanosis or edema  Pulses:   2+ and symmetric all extremities  Skin:   Skin color, texture, turgor normal, no rashes or lesions  Lymph nodes:   Cervical, supraclavicular, and axillary nodes normal  Neurologic:   CNII-XII intact, normal strength, sensation and reflexes    throughout  .    Assessment:    Healthy female exam.    Plan:     Mammogram.   We discussed Addyi. She will research it and let me know if she is interested.

## 2017-07-17 NOTE — Progress Notes (Signed)
No pap - pt had complete hysterectomy PCP does health maintenance labs - she will have them do Vitamin D as well.  Pt has strong family history of breast cancer - she is BRCA negative.

## 2017-07-18 ENCOUNTER — Other Ambulatory Visit: Payer: Self-pay

## 2017-07-18 LAB — COMPREHENSIVE METABOLIC PANEL
A/G RATIO: 2.2 (ref 1.2–2.2)
ALK PHOS: 78 IU/L (ref 39–117)
ALT: 23 IU/L (ref 0–32)
AST: 26 IU/L (ref 0–40)
Albumin: 4.8 g/dL (ref 3.5–5.5)
BILIRUBIN TOTAL: 1 mg/dL (ref 0.0–1.2)
BUN/Creatinine Ratio: 13 (ref 9–23)
BUN: 10 mg/dL (ref 6–24)
CHLORIDE: 101 mmol/L (ref 96–106)
CO2: 27 mmol/L (ref 20–29)
Calcium: 9.7 mg/dL (ref 8.7–10.2)
Creatinine, Ser: 0.78 mg/dL (ref 0.57–1.00)
GFR calc non Af Amer: 87 mL/min/{1.73_m2} (ref >59)
GFR, EST AFRICAN AMERICAN: 100 mL/min/{1.73_m2} (ref >59)
GLUCOSE: 77 mg/dL (ref 65–99)
Globulin, Total: 2.2 g/dL (ref 1.5–4.5)
POTASSIUM: 4.2 mmol/L (ref 3.5–5.2)
Sodium: 144 mmol/L (ref 134–144)
TOTAL PROTEIN: 7 g/dL (ref 6.0–8.5)

## 2017-07-18 LAB — CBC
Hematocrit: 43.3 % (ref 34.0–46.6)
Hemoglobin: 14.5 g/dL (ref 11.1–15.9)
MCH: 29 pg (ref 26.6–33.0)
MCHC: 33.5 g/dL (ref 31.5–35.7)
MCV: 87 fL (ref 79–97)
PLATELETS: 215 10*3/uL (ref 150–379)
RBC: 5 x10E6/uL (ref 3.77–5.28)
RDW: 13.1 % (ref 12.3–15.4)
WBC: 5.3 10*3/uL (ref 3.4–10.8)

## 2017-07-18 LAB — TSH: TSH: 1.03 u[IU]/mL (ref 0.450–4.500)

## 2017-07-18 LAB — T3, FREE: T3, Free: 3.2 pg/mL (ref 2.0–4.4)

## 2017-07-18 LAB — VITAMIN D 25 HYDROXY (VIT D DEFICIENCY, FRACTURES): VIT D 25 HYDROXY: 28.6 ng/mL — AB (ref 30.0–100.0)

## 2017-07-18 LAB — LIPID PANEL
CHOLESTEROL TOTAL: 233 mg/dL — AB (ref 100–199)
Chol/HDL Ratio: 3.4 ratio (ref 0.0–4.4)
HDL: 69 mg/dL (ref >39)
LDL Calculated: 149 mg/dL — ABNORMAL HIGH (ref 0–99)
TRIGLYCERIDES: 73 mg/dL (ref 0–149)
VLDL CHOLESTEROL CAL: 15 mg/dL (ref 5–40)

## 2017-07-18 LAB — HEMOGLOBIN A1C
Est. average glucose Bld gHb Est-mCnc: 105 mg/dL
Hgb A1c MFr Bld: 5.3 % (ref 4.8–5.6)

## 2017-07-18 LAB — T4, FREE: Free T4: 1.14 ng/dL (ref 0.82–1.77)

## 2017-07-18 MED ORDER — VITAMIN D (ERGOCALCIFEROL) 1.25 MG (50000 UNIT) PO CAPS: 50000.0000 [IU] | ORAL_CAPSULE | ORAL | 1 refills | Status: DC

## 2017-07-18 NOTE — Telephone Encounter (Signed)
Vitamin D is low per Dr.Dove patient will need to start Vitamin D. 50,000 units

## 2017-08-07 ENCOUNTER — Encounter: Payer: Self-pay | Admitting: Family Medicine

## 2017-08-08 ENCOUNTER — Ambulatory Visit
Admission: RE | Admit: 2017-08-08 | Discharge: 2017-08-08 | Disposition: A | Discharge status: Home / Self Care | Payer: Federal, State, Local not specified - PPO | Source: Ambulatory Visit | Attending: Obstetrics & Gynecology | Admitting: Obstetrics & Gynecology

## 2017-08-08 DIAGNOSIS — Z1231 Encounter for screening mammogram for malignant neoplasm of breast: Secondary | ICD-10-CM | POA: Diagnosis not present

## 2017-08-12 ENCOUNTER — Encounter: Payer: Self-pay | Admitting: Family Medicine

## 2017-08-12 ENCOUNTER — Ambulatory Visit (INDEPENDENT_AMBULATORY_CARE_PROVIDER_SITE_OTHER): Payer: Federal, State, Local not specified - PPO | Admitting: Family Medicine

## 2017-08-12 VITALS — BP 108/70 | HR 90 | Temp 98.1°F | Ht 64.0 in | Wt 130.0 lb

## 2017-08-12 DIAGNOSIS — Z23 Encounter for immunization: Secondary | ICD-10-CM | POA: Diagnosis not present

## 2017-08-12 DIAGNOSIS — Z0001 Encounter for general adult medical examination with abnormal findings: Secondary | ICD-10-CM

## 2017-08-12 DIAGNOSIS — Z0000 Encounter for general adult medical examination without abnormal findings: Secondary | ICD-10-CM

## 2017-08-12 DIAGNOSIS — E559 Vitamin D deficiency, unspecified: Secondary | ICD-10-CM | POA: Diagnosis not present

## 2017-08-12 DIAGNOSIS — E782 Mixed hyperlipidemia: Secondary | ICD-10-CM

## 2017-08-12 MED ORDER — SIMVASTATIN 10 MG PO TABS
10.0000 mg | ORAL_TABLET | Freq: Every day | ORAL | 3 refills | Status: DC
Start: 2017-08-12 — End: 2017-11-02

## 2017-08-12 NOTE — Assessment & Plan Note (Signed)
Remains elevated. Start zocor 10 mg daily. Follow up labs at [facility] in 8 weeks - more convenient location for pt. The patient indicates understanding of these issues and agrees with the plan.

## 2017-08-12 NOTE — Progress Notes (Signed)
Subjective:   Patient ID: Beverly Cook, female    DOB: [birth year], 52 y.o.   MRN: [id number]  Beverly Cook is a pleasant 52 y.o. year old female who presents to clinic today with Annual Exam (Patient is here today for a CPE.  Fasting labs were completed on 2.7.19.  She just saw GYN last month.  She does not have a Cervix so no longer needs PAPs.  She had Mammogram on Friday at [facility].)  on 08/12/2017  HPI: Health Maintenance  Topic Date Due  . TETANUS/TDAP  07/17/2018 (Originally 11/05/2015)  . HIV Screening  07/17/2018 (Originally 08/31/1978)  . PAP SMEAR  08/19/2028 (Originally 09/17/2014)  . MAMMOGRAM  08/09/2019  . COLONOSCOPY  04/15/2024  . INFLUENZA VACCINE  Completed  . Hepatitis C Screening  Completed   Saw Dr Hulan Fray on 07/17/17 for gyn exam- note reviewed. Remote h/o hysterectomy. No h/o abnormal pap smears.   Mammogram 08/08/17- strong FH of breast CA, she is BRCA neg.  Flex sig 09/05/16   HLD-  LDL remains elevated this year.  She does have a strong FH of HLD. Wt Readings from Last 3 Encounters:  08/12/17 130 lb (59 kg)  07/17/17 129 lb (58.5 kg)  07/09/17 132 lb 8 oz (60.1 kg)    Lab Results  Component Value Date   CHOL 233 (H) 07/17/2017   HDL 69 07/17/2017   LDLCALC 149 (H) 07/17/2017   TRIG 73 07/17/2017   CHOLHDL 3.4 07/17/2017   Lab Results  Component Value Date   ALT 23 07/17/2017   AST 26 07/17/2017   ALKPHOS 78 07/17/2017   BILITOT 1.0 07/17/2017   Lab Results  Component Value Date   WBC 5.3 07/17/2017   HGB 14.5 07/17/2017   HCT 43.3 07/17/2017   MCV 87 07/17/2017   PLT 215 07/17/2017   Lab Results  Component Value Date   TSH 1.030 07/17/2017   Current Outpatient Medications on File Prior to Visit  Medication Sig Dispense Refill  . amitriptyline (ELAVIL) 25 MG tablet Take 1 tablet (25 mg total) by mouth at bedtime. 90 tablet 1  . calcium citrate-vitamin D 200-200 MG-UNIT TABS Take 1 tablet by mouth daily. 600 MG  DAILY    .  cyclobenzaprine (FLEXERIL) 5 MG tablet TAKE 1-2 TABS AS NEEDED AT BEDTIME FOR NECK SPASM 30 tablet 0  . meloxicam (MOBIC) 15 MG tablet Take 1 tablet (15 mg total) by mouth daily. 90 tablet 3  . Vitamin D, Ergocalciferol, (DRISDOL) 50000 units CAPS capsule Take 1 capsule (50,000 Units total) by mouth every 7 (seven) days. 30 capsule 1   No current facility-administered medications on file prior to visit.     No Known Allergies  Past Medical History:  Diagnosis Date  . Depression   . Diverticulosis   . Hyperthyroidism   . Lesion of rectum   . Osteopenia   . Thyroid disease    HYPOTHYRIODISM /GOITER    Past Surgical History:  Procedure Laterality Date  . ABDOMINAL HYSTERECTOMY  2009   T [facility]  . AUGMENTATION MAMMAPLASTY Bilateral 2005  . BILATERAL SALPINGOOPHORECTOMY    . BREAST SURGERY  2004   BREAST AUGMENTATION  . COLONOSCOPY    . FLEXIBLE SIGMOIDOSCOPY N/A 06/30/2015   Procedure: FLEXIBLE SIGMOIDOSCOPY;  Surgeon: Lollie Sails, MD;  Location: [facility] ENDOSCOPY;  Service: Endoscopy;  Laterality: N/A;  . FLEXIBLE SIGMOIDOSCOPY N/A 09/05/2016   Procedure: FLEXIBLE SIGMOIDOSCOPY;  Surgeon: Lollie Sails, MD;  Location: [facility]  ENDOSCOPY;  Service: Endoscopy;  Laterality: N/A;  . RECTAL SURGERY  2017  . WISDOM TOOTH EXTRACTION      Family History  Problem Relation Age of Onset  . Hypertension Mother   . Hypertension Father   . Hyperlipidemia Father   . Melanoma Father   . Breast cancer Paternal Aunt 71  . Diabetes Maternal Grandmother   . Breast cancer Paternal Aunt 50  . Breast cancer Cousin 52  . Breast cancer Paternal Aunt 74  . Breast cancer Cousin 56    Social History   Socioeconomic History  . Marital status: Married    Spouse name: Not on file  . Number of children: Not on file  . Years of education: Not on file  . Highest education level: Not on file  Social Needs  . Financial resource strain: Not on file  . Food insecurity - worry: Not on file  . Food  insecurity - inability: Not on file  . Transportation needs - medical: Not on file  . Transportation needs - non-medical: Not on file  Occupational History  . Not on file  Tobacco Use  . Smoking status: Current Every Day Smoker    Packs/day: 0.25    Types: Cigarettes  . Smokeless tobacco: Never Used  Substance and Sexual Activity  . Alcohol use: Yes    Alcohol/week: 0.6 oz    Types: 1 Glasses of wine per week  . Drug use: No  . Sexual activity: Yes    Partners: Male    Birth control/protection: Other-see comments, Surgical  Other Topics Concern  . Not on file  Social History Narrative  . Not on file   The PMH, PSH, Social History, Family History, Medications, and allergies have been reviewed in [facility], and have been updated if relevant.   Review of Systems  Constitutional: Negative.   HENT: Negative.   Eyes: Negative.   Respiratory: Negative.   Cardiovascular: Negative for chest pain, palpitations and leg swelling.  Endocrine: Negative.   Genitourinary: Negative.   Musculoskeletal: Negative for back pain.  Skin: Negative.   Allergic/Immunologic: Negative.   Neurological: Negative.   Hematological: Negative.   Psychiatric/Behavioral: Negative.   All other systems reviewed and are negative.      Objective:    BP 108/70 (BP Location: Left Arm, Patient Position: Sitting, Cuff Size: Normal)   Pulse 90   Temp 98.1 F (36.7 C) (Oral)   Ht 5' 4"  (1.626 m)   Wt 130 lb (59 kg)   SpO2 98%   BMI 22.31 kg/m    Physical Exam    General:  Well-developed,well-nourished,in no acute distress; alert,appropriate and cooperative throughout examination Cook:  normocephalic and atraumatic.   Eyes:  vision grossly intact, PERRL Ears:  R ear normal and L ear normal externally, TMs clear bilaterally Nose:  no external deformity.   Mouth:  good dentition.   Neck:  No deformities, masses, or tenderness noted. Lungs:  Normal respiratory effort, chest expands symmetrically. Lungs  are clear to auscultation, no crackles or wheezes. Heart:  Normal rate and regular rhythm. S1 and S2 normal without gallop, murmur, click, rub or other extra sounds. Abdomen:  Bowel sounds positive,abdomen soft and non-tender without masses, organomegaly or hernias noted. Msk:  No deformity or scoliosis noted of thoracic or lumbar spine.   Extremities:  No clubbing, cyanosis, edema, or deformity noted with normal full range of motion of all joints.   Neurologic:  alert & oriented X3 and gait normal.  Skin:  Intact without suspicious lesions or rashes Cervical Nodes:  No lymphadenopathy noted Axillary Nodes:  No palpable lymphadenopathy Psych:  Cognition and judgment appear intact. Alert and cooperative with normal attention span and concentration. No apparent delusions, illusions, hallucinations       Assessment & Plan:   Need for Tdap vaccination - Plan: Tdap vaccine greater than or equal to 7yo IM  Well woman exam without gynecological exam No Follow-up on file.

## 2017-08-12 NOTE — Assessment & Plan Note (Signed)
Reviewed preventive care protocols, scheduled due services, and updated immunizations Discussed nutrition, exercise, diet, and healthy lifestyle.  

## 2017-08-12 NOTE — Patient Instructions (Signed)
Great to see you. We are starting zocor 10 mg nightly. Have labs done at [facility] (fasting) in 8 weeks.

## 2017-10-20 ENCOUNTER — Other Ambulatory Visit
Admission: RE | Admit: 2017-10-20 | Discharge: 2017-10-20 | Disposition: A | Discharge status: Home / Self Care | Payer: Federal, State, Local not specified - PPO | Source: Ambulatory Visit | Attending: Family Medicine | Admitting: Family Medicine

## 2017-10-20 DIAGNOSIS — E782 Mixed hyperlipidemia: Secondary | ICD-10-CM | POA: Insufficient documentation

## 2017-10-20 LAB — COMPREHENSIVE METABOLIC PANEL
ALT: 32 U/L (ref 14–54)
AST: 29 U/L (ref 15–41)
Albumin: 4.4 g/dL (ref 3.5–5.0)
Alkaline Phosphatase: 66 U/L (ref 38–126)
Anion gap: 3 — ABNORMAL LOW (ref 5–15)
BILIRUBIN TOTAL: 1.1 mg/dL (ref 0.3–1.2)
BUN: 10 mg/dL (ref 6–20)
CHLORIDE: 104 mmol/L (ref 101–111)
CO2: 33 mmol/L — ABNORMAL HIGH (ref 22–32)
Calcium: 8.9 mg/dL (ref 8.9–10.3)
Creatinine, Ser: 0.59 mg/dL (ref 0.44–1.00)
Glucose, Bld: 99 mg/dL (ref 65–99)
Potassium: 4.4 mmol/L (ref 3.5–5.1)
Sodium: 140 mmol/L (ref 135–145)
TOTAL PROTEIN: 7 g/dL (ref 6.5–8.1)

## 2017-10-20 LAB — LIPID PANEL
CHOLESTEROL: 169 mg/dL (ref 0–200)
HDL: 66 mg/dL (ref >40)
LDL Cholesterol: 86 mg/dL (ref 0–99)
TRIGLYCERIDES: 86 mg/dL (ref <150)
Total CHOL/HDL Ratio: 2.6 RATIO
VLDL: 17 mg/dL (ref 0–40)

## 2017-11-02 ENCOUNTER — Other Ambulatory Visit: Payer: Self-pay | Admitting: Family Medicine

## 2017-11-25 DIAGNOSIS — K080 Exfoliation of teeth due to systemic causes: Secondary | ICD-10-CM | POA: Diagnosis not present

## 2018-01-21 ENCOUNTER — Other Ambulatory Visit: Payer: Self-pay | Admitting: Family Medicine

## 2018-02-15 ENCOUNTER — Other Ambulatory Visit: Payer: Self-pay | Admitting: Family Medicine

## 2018-02-19 DIAGNOSIS — L821 Other seborrheic keratosis: Secondary | ICD-10-CM | POA: Diagnosis not present

## 2018-02-19 DIAGNOSIS — D225 Melanocytic nevi of trunk: Secondary | ICD-10-CM | POA: Diagnosis not present

## 2018-02-19 DIAGNOSIS — L578 Other skin changes due to chronic exposure to nonionizing radiation: Secondary | ICD-10-CM | POA: Diagnosis not present

## 2018-02-19 DIAGNOSIS — L570 Actinic keratosis: Secondary | ICD-10-CM | POA: Diagnosis not present

## 2018-02-19 DIAGNOSIS — Z1283 Encounter for screening for malignant neoplasm of skin: Secondary | ICD-10-CM | POA: Diagnosis not present

## 2018-05-25 ENCOUNTER — Encounter: Payer: Self-pay | Admitting: Radiology

## 2018-06-25 ENCOUNTER — Other Ambulatory Visit: Payer: Self-pay | Admitting: Family Medicine

## 2018-06-30 DIAGNOSIS — K080 Exfoliation of teeth due to systemic causes: Secondary | ICD-10-CM | POA: Diagnosis not present

## 2018-07-16 DIAGNOSIS — K629 Disease of anus and rectum, unspecified: Secondary | ICD-10-CM | POA: Diagnosis not present

## 2018-07-20 ENCOUNTER — Encounter: Payer: Self-pay | Admitting: Obstetrics & Gynecology

## 2018-07-20 ENCOUNTER — Ambulatory Visit (INDEPENDENT_AMBULATORY_CARE_PROVIDER_SITE_OTHER): Payer: Federal, State, Local not specified - PPO | Admitting: Obstetrics & Gynecology

## 2018-07-20 VITALS — BP 113/71 | HR 71 | Wt 127.6 lb

## 2018-07-20 DIAGNOSIS — Z01419 Encounter for gynecological examination (general) (routine) without abnormal findings: Secondary | ICD-10-CM | POA: Diagnosis not present

## 2018-07-20 NOTE — Progress Notes (Signed)
Subjective:    Beverly Cook is a 53 y.o. married P2 female who presents for an annual exam. The patient has no complaints today. The patient is sexually active. GYN screening history: last pap: was normal. The patient wears seatbelts: yes. The patient participates in regular exercise: yes. Has the patient ever been transfused or tattooed?: no. The patient reports that there is not domestic violence in her life.   Menstrual History: OB History    Gravida  2   Para  2   Term      Preterm      AB      Living  2     SAB      TAB      Ectopic      Multiple      Live Births              Menarche age: 52 No LMP recorded. Patient has had a hysterectomy.    The following portions of the patient's history were reviewed and updated as appropriate: allergies, current medications, past family history, past medical history, past social history, past surgical history and problem list.  Review of Systems Pertinent items are noted in HPI.   BRCA negative Married since 2002 Works at [employer], colonoscopy UTD   Objective:    BP 113/71   Pulse 71   Wt 127 lb 9.6 oz (57.9 kg)   BMI 21.90 kg/m   General Appearance:    Alert, cooperative, no distress, appears stated age  Head:    Normocephalic, without obvious abnormality, atraumatic  Eyes:    PERRL, conjunctiva/corneas clear, EOM's intact, fundi    benign, both eyes  Ears:    Normal TM's and external ear canals, both ears  Nose:   Nares normal, septum midline, mucosa normal, no drainage    or sinus tenderness  Throat:   Lips, mucosa, and tongue normal; teeth and gums normal  Neck:   Supple, symmetrical, trachea midline, no adenopathy;    thyroid:  no enlargement/tenderness/nodules; no carotid   bruit or JVD  Back:     Symmetric, no curvature, ROM normal, no CVA tenderness  Lungs:     Clear to auscultation bilaterally, respirations unlabored  Chest Wall:    No tenderness or deformity   Heart:     Regular rate and rhythm, S1 and S2 normal, no murmur, rub   or gallop  Breast Exam:    No tenderness, masses, or nipple abnormality  Abdomen:     Soft, non-tender, bowel sounds active all four quadrants,    no masses, no organomegaly  Genitalia:    Normal female without lesion, discharge or tenderness, marked vulvovaginal atrophy, no masses with bimanual exam     Extremities:   Extremities normal, atraumatic, no cyanosis or edema  Pulses:   2+ and symmetric all extremities  Skin:   Skin color, texture, turgor normal, no rashes or lesions  Lymph nodes:   Cervical, supraclavicular, and axillary nodes normal  Neurologic:   CNII-XII intact, normal strength, sensation and reflexes    throughout  .    Assessment:    Healthy female exam.    Plan:     Continue healthy lifestyle

## 2018-07-20 NOTE — Progress Notes (Signed)
FLU UTD  MAMMOGRAM

## 2018-08-17 ENCOUNTER — Ambulatory Visit
Admission: RE | Admit: 2018-08-17 | Discharge: 2018-08-17 | Disposition: A | Discharge status: Home / Self Care | Payer: Federal, State, Local not specified - PPO | Source: Ambulatory Visit | Attending: Obstetrics & Gynecology | Admitting: Obstetrics & Gynecology

## 2018-08-17 ENCOUNTER — Ambulatory Visit: Payer: Federal, State, Local not specified - PPO | Admitting: Family Medicine

## 2018-08-17 ENCOUNTER — Encounter: Payer: Self-pay | Admitting: Family Medicine

## 2018-08-17 VITALS — BP 110/62 | HR 66 | Temp 98.2°F | Resp 12 | Ht 64.25 in | Wt 123.5 lb

## 2018-08-17 DIAGNOSIS — Z1231 Encounter for screening mammogram for malignant neoplasm of breast: Secondary | ICD-10-CM | POA: Insufficient documentation

## 2018-08-17 DIAGNOSIS — Z720 Tobacco use: Secondary | ICD-10-CM

## 2018-08-17 DIAGNOSIS — E559 Vitamin D deficiency, unspecified: Secondary | ICD-10-CM

## 2018-08-17 DIAGNOSIS — Z01419 Encounter for gynecological examination (general) (routine) without abnormal findings: Secondary | ICD-10-CM

## 2018-08-17 DIAGNOSIS — E782 Mixed hyperlipidemia: Secondary | ICD-10-CM

## 2018-08-17 DIAGNOSIS — E041 Nontoxic single thyroid nodule: Secondary | ICD-10-CM | POA: Diagnosis not present

## 2018-08-17 DIAGNOSIS — M47816 Spondylosis without myelopathy or radiculopathy, lumbar region: Secondary | ICD-10-CM

## 2018-08-17 LAB — TSH: TSH: 0.37 u[IU]/mL (ref 0.35–4.50)

## 2018-08-17 LAB — T4, FREE: Free T4: 0.78 ng/dL (ref 0.60–1.60)

## 2018-08-17 LAB — VITAMIN D 25 HYDROXY (VIT D DEFICIENCY, FRACTURES): VITD: 66.21 ng/mL (ref 30.00–100.00)

## 2018-08-17 MED ORDER — CYCLOBENZAPRINE HCL 5 MG PO TABS: 5.0000 mg | ORAL_TABLET | Freq: Three times a day (TID) | ORAL | 1 refills | Status: DC | PRN

## 2018-08-17 NOTE — Assessment & Plan Note (Signed)
Routing labs to monitor given hx of nodule.

## 2018-08-17 NOTE — Assessment & Plan Note (Signed)
3 cig/day. Not interested in stopping completely today. Does not feel she will need medication to stop when she decides she wants to.

## 2018-08-17 NOTE — Patient Instructions (Signed)
We will get labs today.  °

## 2018-08-17 NOTE — Progress Notes (Signed)
Subjective:     Beverly Cook is a 53 y.o. female presenting for Transfer of Care (from Dr. Deborra Medina.)     HPI  #Spondylosis - takes meloxicam with flare ups of back pain  #Vitamin D deficiency - has been on once weekly supplement for 2 years - low 2 years in a row - OB/GYN has been filling prescription - 1.5 years since last check  #HLD - occasionally forgets to take medication - will continue to take  #Tobacco use - quit x 1 month in jan - smoking 3 cig/day - never a huge smoker - does not want support in quitting, feels she will do so when she can  Review of Systems  Constitutional: Negative for chills and fever.  Musculoskeletal: Negative for back pain.  Neurological: Negative for headaches.     Social History   Tobacco Use  Smoking Status Current Every Day Smoker  . Packs/day: 0.25  . Years: 30.00  . Pack years: 7.50  . Types: Cigarettes  Smokeless Tobacco Never Used        Objective:    BP Readings from Last 3 Encounters:  08/17/18 110/62  07/20/18 113/71  08/12/17 108/70   Wt Readings from Last 3 Encounters:  08/17/18 123 lb 8 oz (56 kg)  07/20/18 127 lb 9.6 oz (57.9 kg)  08/12/17 130 lb (59 kg)    BP 110/62   Pulse 66   Temp 98.2 F (36.8 C)   Resp 12   Ht 5' 4.25" (1.632 m)   Wt 123 lb 8 oz (56 kg)   SpO2 98%   BMI 21.03 kg/m    Physical Exam Constitutional:      General: She is not in acute distress.    Appearance: She is well-developed. She is not diaphoretic.  HENT:     Right Ear: External ear normal.     Left Ear: External ear normal.     Nose: Nose normal.  Eyes:     Conjunctiva/sclera: Conjunctivae normal.  Neck:     Musculoskeletal: Neck supple.  Cardiovascular:     Rate and Rhythm: Normal rate.  Pulmonary:     Effort: Pulmonary effort is normal.  Skin:    General: Skin is warm and dry.     Capillary Refill: Capillary refill takes less than 2 seconds.  Neurological:     Mental Status: She is alert. Mental  status is at baseline.  Psychiatric:        Mood and Affect: Mood normal.        Behavior: Behavior normal.      The 10-year ASCVD risk score Mikey Bussing DC Jr., et al., 2013) is: 2.3%   Values used to calculate the score:     Age: 34 years     Sex: Female     Is Non-Hispanic African American: No     Diabetic: No     Tobacco smoker: Yes     Systolic Blood Pressure: 010 mmHg     Is BP treated: No     HDL Cholesterol: 66 mg/dL     Total Cholesterol: 169 mg/dL      Assessment & Plan:   Problem List Items Addressed This Visit      Endocrine   Thyroid nodule - Primary    Routing labs to monitor given hx of nodule.       Relevant Orders   TSH (Completed)   T4, free (Completed)     Musculoskeletal and Integument   Spondylosis  of lumbar region without myelopathy or radiculopathy    Stable. Occasional Meloxicam. Flexeril provided for prn use with flare up      Relevant Medications   cyclobenzaprine (FLEXERIL) 5 MG tablet     Other   Tobacco user    3 cig/day. Not interested in stopping completely today. Does not feel she will need medication to stop when she decides she wants to.       HLD (hyperlipidemia)    ASCVD prior to starting medication was 3.2% and currently 2.3%. Discussed that she does not meet specific guidelines to start statin therapy and could trial coming off the medication if she would like. Elected to continue medication. Labs next year.       Vitamin D deficiency    On weekly replacement for levels in the mid 20s. Will repeat today. May discuss trial off weekly replacement.       Relevant Orders   VITAMIN D 25 Hydroxy (Vit-D Deficiency, Fractures) (Completed)       Return in about 1 year (around 08/17/2019).  Lesleigh Noe, MD

## 2018-08-17 NOTE — Assessment & Plan Note (Signed)
On weekly replacement for levels in the mid 20s. Will repeat today. May discuss trial off weekly replacement.

## 2018-08-17 NOTE — Assessment & Plan Note (Signed)
ASCVD prior to starting medication was 3.2% and currently 2.3%. Discussed that she does not meet specific guidelines to start statin therapy and could trial coming off the medication if she would like. Elected to continue medication. Labs next year.

## 2018-08-17 NOTE — Assessment & Plan Note (Signed)
Stable. Occasional Meloxicam. Flexeril provided for prn use with flare up

## 2018-08-20 ENCOUNTER — Telehealth: Payer: Self-pay

## 2018-08-20 ENCOUNTER — Ambulatory Visit: Payer: Self-pay | Admitting: Internal Medicine

## 2018-08-20 ENCOUNTER — Ambulatory Visit (INDEPENDENT_AMBULATORY_CARE_PROVIDER_SITE_OTHER): Payer: Federal, State, Local not specified - PPO | Admitting: Family Medicine

## 2018-08-20 ENCOUNTER — Other Ambulatory Visit: Payer: Self-pay

## 2018-08-20 ENCOUNTER — Encounter: Payer: Self-pay | Admitting: Family Medicine

## 2018-08-20 VITALS — BP 110/76 | HR 112 | Temp 101.3°F | Resp 16

## 2018-08-20 DIAGNOSIS — R509 Fever, unspecified: Secondary | ICD-10-CM | POA: Diagnosis not present

## 2018-08-20 DIAGNOSIS — J101 Influenza due to other identified influenza virus with other respiratory manifestations: Secondary | ICD-10-CM | POA: Diagnosis not present

## 2018-08-20 LAB — POCT INFLUENZA A/B
INFLUENZA B, POC: NEGATIVE
Influenza A, POC: POSITIVE — AB

## 2018-08-20 MED ORDER — OSELTAMIVIR PHOSPHATE 75 MG PO CAPS: 75.0000 mg | ORAL_CAPSULE | Freq: Two times a day (BID) | ORAL | 0 refills | Status: AC

## 2018-08-20 NOTE — Telephone Encounter (Signed)
Patient states that she started to develop symptoms yesterday-08/19/2018. Fever-102 last night, cough-deep chest cough, body aches, chills, runny nose, sore throat yesterday. Patient states that she has not traveled out of the country. 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 Discussed with Dr. Silvio Pate and Dr. Einar Pheasant and patient advised to come in and go to back parking lot of the building and to give [place] a call when she gets here to start treatment/evaluation.

## 2018-08-20 NOTE — Patient Instructions (Signed)
You have the flu.   This is highly contagious -- you are contagious 1-2 days before you develop symptoms and for up to 7 days after becoming sick.   Oseltamivir (Tamiflu) -- is helpful if started within 48 hours of symptoms. Ideally within 24 hours of symptoms. This may decrease the length of illness by 1 day and decrease the severity of your illness.

## 2018-08-20 NOTE — Telephone Encounter (Signed)
Seen, flu positive

## 2018-08-20 NOTE — Progress Notes (Signed)
Subjective:     Beverly Cook is a 53 y.o. female presenting for Cough     URI   This is a new problem. The current episode started yesterday. The problem has been gradually worsening. The maximum temperature recorded prior to her arrival was 101 - 101.9 F. Associated symptoms include chest pain, congestion, coughing, headaches, rhinorrhea and a sore throat. Pertinent negatives include no abdominal pain, nausea or vomiting.    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   Respiratory: Positive for cough. Negative for shortness of breath.   Cardiovascular: Positive for chest pain.  Gastrointestinal: Negative for abdominal pain, nausea and vomiting.  Musculoskeletal: Positive for myalgias.  Neurological: Positive for headaches.     Social History   Tobacco Use  Smoking Status Current Every Day Smoker  . Packs/day: 0.25  . Years: 30.00  . Pack years: 7.50  . Types: Cigarettes  Smokeless Tobacco Never Used        Objective:    BP Readings from Last 3 Encounters:  08/20/18 110/76  08/17/18 110/62  07/20/18 113/71   Wt Readings from Last 3 Encounters:  08/17/18 123 lb 8 oz (56 kg)  07/20/18 127 lb 9.6 oz (57.9 kg)  08/12/17 130 lb (59 kg)    BP 110/76 (BP Location: Right Arm, Patient Position: Sitting)   Pulse (!) 112   Temp (!) 101.3 F (38.5 C) (Oral)   Resp 16   SpO2 96%    Physical Exam Constitutional:      General: She is not in acute distress.    Appearance: She is well-developed. She is not diaphoretic.  HENT:     Head: Normocephalic and atraumatic.     Right Ear: Tympanic membrane and ear canal normal.     Left Ear: Tympanic membrane and ear canal normal.     Nose: Mucosal edema and rhinorrhea present.     Right Sinus: No maxillary sinus tenderness or frontal sinus tenderness.     Left Sinus: No  maxillary sinus tenderness or frontal sinus tenderness.     Mouth/Throat:     Pharynx: Uvula midline. Posterior oropharyngeal erythema present. No oropharyngeal exudate.     Tonsils: Swelling: 0 on the right. 0 on the left.  Eyes:     General: No scleral icterus.    Conjunctiva/sclera: Conjunctivae normal.  Neck:     Musculoskeletal: Neck supple.  Cardiovascular:     Rate and Rhythm: Normal rate and regular rhythm.     Heart sounds: Normal heart sounds. No murmur.  Pulmonary:     Effort: Pulmonary effort is normal. No respiratory distress.     Breath sounds: Normal breath sounds.  Lymphadenopathy:     Cervical: No cervical adenopathy.  Skin:    General: Skin is warm and dry.     Capillary Refill: Capillary refill takes less than 2 seconds.  Neurological:     Mental Status: She is alert.     Ignore duplicated exam. Duplicated in error Physical Exam  Constitutional: She appears well-developed. No distress.  HENT:  Head: Normocephalic and atraumatic.  Right Ear: Tympanic membrane and ear canal normal.  Left Ear: Tympanic membrane and ear canal normal.  Nose: Mucosal edema and rhinorrhea present. Right sinus exhibits no maxillary sinus tenderness and no frontal sinus tenderness. Left sinus exhibits no maxillary sinus  tenderness and no frontal sinus tenderness.  Mouth/Throat: Uvula is midline. Posterior oropharyngeal erythema present. No oropharyngeal exudate. Tonsils are 0 on the right. Tonsils are 0 on the left.  Eyes: Conjunctivae are normal. No scleral icterus.  Neck: Neck supple.  Cardiovascular: Normal rate, regular rhythm and normal heart sounds.  No murmur heard. Pulmonary/Chest: Effort normal and breath sounds normal. No respiratory distress.  Lymphadenopathy:    She has no cervical adenopathy.  Neurological: She is alert.  Skin: Skin is warm and dry. Capillary refill takes less than 2 seconds. She is not diaphoretic.   Rapid Flu: positive for A     Assessment &  Plan:   Problem List Items Addressed This Visit    None    Visit Diagnoses    Influenza A    -  Primary   Relevant Medications   oseltamivir (TAMIFLU) 75 MG capsule   Fever, unspecified fever cause       Relevant Orders   POCT Influenza A/B (Completed)     Given that coronavirus was not direct contact and positive for Flu no need for additional testing.    Return if symptoms worsen or fail to improve.  Lesleigh Noe, MD

## 2018-09-28 ENCOUNTER — Other Ambulatory Visit: Payer: Self-pay | Admitting: Obstetrics & Gynecology

## 2018-10-01 ENCOUNTER — Encounter: Payer: Self-pay | Admitting: Family Medicine

## 2018-10-01 DIAGNOSIS — R053 Chronic cough: Secondary | ICD-10-CM

## 2018-10-01 DIAGNOSIS — R05 Cough: Secondary | ICD-10-CM

## 2018-10-01 NOTE — Telephone Encounter (Signed)
Does patient need office visit? Last office visit in March said to return if not better

## 2018-10-01 NOTE — Addendum Note (Signed)
Addended by: Lesleigh Noe on: 10/01/2018 02:40 PM   Modules accepted: Orders

## 2018-10-01 NOTE — Addendum Note (Signed)
Addended by: Kris Mouton on: 10/01/2018 03:45 PM   Modules accepted: Orders

## 2018-10-02 ENCOUNTER — Encounter: Payer: Self-pay | Admitting: Family Medicine

## 2018-10-02 ENCOUNTER — Other Ambulatory Visit: Payer: Self-pay

## 2018-10-02 ENCOUNTER — Ambulatory Visit (INDEPENDENT_AMBULATORY_CARE_PROVIDER_SITE_OTHER)
Admission: RE | Admit: 2018-10-02 | Discharge: 2018-10-02 | Disposition: A | Discharge status: Home / Self Care | Payer: Federal, State, Local not specified - PPO | Source: Ambulatory Visit | Attending: Family Medicine | Admitting: Family Medicine

## 2018-10-02 DIAGNOSIS — R05 Cough: Secondary | ICD-10-CM

## 2018-10-02 DIAGNOSIS — R053 Chronic cough: Secondary | ICD-10-CM

## 2018-10-16 ENCOUNTER — Other Ambulatory Visit: Payer: Self-pay | Admitting: Obstetrics & Gynecology

## 2018-10-26 ENCOUNTER — Other Ambulatory Visit: Payer: Self-pay | Admitting: Family Medicine

## 2018-10-26 NOTE — Telephone Encounter (Signed)
JC-Plz see refill req/thx dmf 

## 2018-12-17 ENCOUNTER — Other Ambulatory Visit: Payer: Self-pay

## 2018-12-17 ENCOUNTER — Other Ambulatory Visit
Admission: RE | Admit: 2018-12-17 | Discharge: 2018-12-17 | Disposition: A | Discharge status: Home / Self Care | Payer: Federal, State, Local not specified - PPO | Source: Ambulatory Visit | Attending: Gastroenterology | Admitting: Gastroenterology

## 2018-12-17 DIAGNOSIS — Z1159 Encounter for screening for other viral diseases: Secondary | ICD-10-CM | POA: Insufficient documentation

## 2018-12-17 DIAGNOSIS — Z01812 Encounter for preprocedural laboratory examination: Secondary | ICD-10-CM | POA: Diagnosis not present

## 2018-12-18 ENCOUNTER — Encounter: Payer: Self-pay | Admitting: *Deleted

## 2018-12-18 LAB — SARS CORONAVIRUS 2 (TAT 6-24 HRS): SARS Coronavirus 2: NEGATIVE

## 2018-12-21 ENCOUNTER — Other Ambulatory Visit: Payer: Self-pay

## 2018-12-21 ENCOUNTER — Ambulatory Visit
Admission: RE | Admit: 2018-12-21 | Discharge: 2018-12-21 | Disposition: A | Discharge status: Home / Self Care | Payer: Federal, State, Local not specified - PPO | Attending: Gastroenterology | Admitting: Gastroenterology

## 2018-12-21 ENCOUNTER — Encounter
Admission: RE | Disposition: A | Discharge status: Home / Self Care | Payer: Self-pay | Source: Home / Self Care | Attending: Gastroenterology

## 2018-12-21 DIAGNOSIS — Z79899 Other long term (current) drug therapy: Secondary | ICD-10-CM | POA: Diagnosis not present

## 2018-12-21 DIAGNOSIS — K5730 Diverticulosis of large intestine without perforation or abscess without bleeding: Secondary | ICD-10-CM | POA: Diagnosis not present

## 2018-12-21 DIAGNOSIS — K6282 Dysplasia of anus: Secondary | ICD-10-CM | POA: Diagnosis not present

## 2018-12-21 DIAGNOSIS — Z09 Encounter for follow-up examination after completed treatment for conditions other than malignant neoplasm: Secondary | ICD-10-CM | POA: Diagnosis not present

## 2018-12-21 DIAGNOSIS — M8580 Other specified disorders of bone density and structure, unspecified site: Secondary | ICD-10-CM | POA: Insufficient documentation

## 2018-12-21 DIAGNOSIS — Z791 Long term (current) use of non-steroidal anti-inflammatories (NSAID): Secondary | ICD-10-CM | POA: Insufficient documentation

## 2018-12-21 DIAGNOSIS — F329 Major depressive disorder, single episode, unspecified: Secondary | ICD-10-CM | POA: Insufficient documentation

## 2018-12-21 DIAGNOSIS — E0590 Thyrotoxicosis, unspecified without thyrotoxic crisis or storm: Secondary | ICD-10-CM | POA: Diagnosis not present

## 2018-12-21 HISTORY — PX: FLEXIBLE SIGMOIDOSCOPY: SHX5431

## 2018-12-21 HISTORY — DX: Other recurrent depressive disorders: F33.8

## 2018-12-21 SURGERY — SIGMOIDOSCOPY, FLEXIBLE

## 2018-12-21 MED ORDER — SODIUM CHLORIDE 0.9 % IV SOLN: INTRAVENOUS | Status: DC

## 2018-12-21 MED ORDER — SODIUM CHLORIDE 0.9 % IV SOLN
INTRAVENOUS | Status: DC
  Administered 2018-12-21: 14:00:00 via INTRAVENOUS

## 2018-12-21 MED ORDER — MIDAZOLAM HCL 5 MG/5ML IJ SOLN
INTRAMUSCULAR | Status: DC | PRN
  Administered 2018-12-21 (×2): 1 mg via INTRAVENOUS
  Administered 2018-12-21: 2 mg via INTRAVENOUS

## 2018-12-21 MED ORDER — FENTANYL CITRATE (PF) 100 MCG/2ML IJ SOLN
INTRAMUSCULAR | Status: DC | PRN
  Administered 2018-12-21 (×2): 25 ug via INTRAVENOUS

## 2018-12-21 MED ORDER — MIDAZOLAM HCL 5 MG/5ML IJ SOLN
INTRAMUSCULAR | Status: AC
  Filled 2018-12-21: qty 5

## 2018-12-21 MED ORDER — FENTANYL CITRATE (PF) 100 MCG/2ML IJ SOLN
INTRAMUSCULAR | Status: AC
  Filled 2018-12-21: qty 2

## 2018-12-21 NOTE — H&P (Signed)
Outpatient short stay form Pre-procedure 12/21/2018 2:02 PM Beverly Sails MD  Primary Physician: Dr. Arnette Norris  Reason for visit: Sedated flexible sigmoidoscopy  History of present illness: Patient is a 53 year old female presenting today for follow-up anal canal condyloma type lesion.  She has had some serial flexible sigmoidoscopies in regards to follow-up of a high-grade squamous intraepithelial lesion.  On her last biopsy it was noted that the polyp biopsy edges were free of dysplasia.  There was no evidence of invasion.  Her last procedure was 06/30/2015.  She tolerated her prep well.  sHe is desirous of sedation we will proceed with conscious sedation.  Takes no aspirin or blood thinner.    Current Facility-Administered Medications:  .  0.9 %  sodium chloride infusion, , Intravenous, Continuous, Beverly Sails, MD, Last Rate: 20 mL/hr at 12/21/18 1350 .  0.9 %  sodium chloride infusion, , Intravenous, Continuous, Beverly Sails, MD  Medications Prior to Admission  Medication Sig Dispense Refill Last Dose  . Calcium Carb-Cholecalciferol (HM CALCIUM-VITAMIN D) 600-800 MG-UNIT TABS Take by mouth.   Past Week at Unknown time  . cyclobenzaprine (FLEXERIL) 5 MG tablet Take 1 tablet (5 mg total) by mouth 3 (three) times daily as needed for muscle spasms. 30 tablet 1 Past Month at Unknown time  . meloxicam (MOBIC) 15 MG tablet Take 1 tablet (15 mg total) by mouth daily. 90 tablet 3 Past Week at Unknown time  . simvastatin (ZOCOR) 10 MG tablet TAKE 1 TABLET BY MOUTH EVERYDAY AT BEDTIME 90 tablet 1   . Vitamin D, Ergocalciferol, (DRISDOL) 1.25 MG (50000 UT) CAPS capsule TAKE 1 CAPSULE (50,000 UNITS TOTAL) BY MOUTH EVERY 7 (SEVEN) DAYS. 12 capsule 4      No Known Allergies   Past Medical History:  Diagnosis Date  . Depression   . Diverticulosis   . Hyperthyroidism   . Hyperthyroidism   . Lesion of rectum   . Osteopenia   . Seasonal affective disorder ([place])   . Thyroid  disease    HYPOTHYRIODISM /GOITER    Review of systems:      Physical Exam    Heart and lungs: Regular rate and rhythm without rub or gallop lungs are bilaterally clear    HEENT: Normocephalic atraumatic eyes are anicteric    Other:    Pertinant exam for procedure: Soft nontender nondistended bowel sounds positive normoactive.    Planned proceedures: Sedated flexible sigmoidoscopy and indicated procedures. I have discussed the risks benefits and complications of procedures to include not limited to bleeding, infection, perforation and the risk of sedation and the patient wishes to proceed.    Beverly Sails, MD Gastroenterology 12/21/2018  2:02 PM

## 2018-12-21 NOTE — Op Note (Addendum)
[facility] Patient Name: Beverly Cook Procedure Date: 12/21/2018 2:02 PM MRN: [id number] Account #: [id number] Date of Birth: 01-04-1966 Admit Type: Outpatient Age: 53 Room: [facility] ENDO ROOM 3 Gender: Female Note Status: Finalized Procedure:            Flexible Sigmoidoscopy Indications:          follow up of anal canal lesion. Providers:            Lollie Sails, MD Referring MD:         Jobe Marker. Einar Pheasant (Referring MD) Medicines:            Fentanyl 50 micrograms IV, Midazolam 4 mg IV Complications:        No immediate complications. Procedure:            Pre-Anesthesia Assessment:                       - ASA Grade Assessment: II - A patient with mild                        systemic disease.                       After obtaining informed consent, the scope was passed                        under direct vision. The Colonoscope was introduced                        through the anus and advanced to the the sigmoid colon,                        about 22 cm from the anal verge. The flexible                        sigmoidoscopy was accomplished without difficulty. The                        patient tolerated the procedure well. Findings:      A few small-mouthed diverticula were found in the distal sigmoid colon.      The tatoo previously placed in the anal canal was easily located. close       inspection of the area showed no evidence of recurrent lesion.      The entire examined colon appeared normal. Retroflex evaluation of the       distal rectum is also normal. Impression:           - Diverticulosis in the distal sigmoid colon.                       - The entire examined colon is normal.                       - No specimens collected. Recommendation:       - Perform a colonoscopy in 3 years. Procedure Code(s):    --- Professional ---                       [phone], Sigmoidoscopy, flexible; diagnostic, including   collection of specimen(s) by brushing or washing, when  performed (separate procedure) Diagnosis Code(s):    --- Professional ---                       K57.30, Diverticulosis of large intestine without                        perforation or abscess without bleeding CPT copyright 2019 American Medical Association. All rights reserved. The codes documented in this report are preliminary and upon coder review may  be revised to meet current compliance requirements. Lollie Sails, MD 12/21/2018 2:30:58 PM This report has been signed electronically. Number of Addenda: 0 Note Initiated On: 12/21/2018 2:02 PM Total Procedure Duration: 0 hours 5 minutes 43 seconds       [facility]

## 2018-12-22 ENCOUNTER — Encounter: Payer: Self-pay | Admitting: Gastroenterology

## 2019-01-04 ENCOUNTER — Encounter: Payer: Self-pay | Admitting: Family Medicine

## 2019-01-04 DIAGNOSIS — K5730 Diverticulosis of large intestine without perforation or abscess without bleeding: Secondary | ICD-10-CM | POA: Insufficient documentation

## 2019-01-20 DIAGNOSIS — K080 Exfoliation of teeth due to systemic causes: Secondary | ICD-10-CM | POA: Diagnosis not present

## 2019-02-05 IMAGING — MR MR LUMBAR SPINE W/O CM
5 series · 37 of 48 positions shown · non-contrast
Comparison: None.

CLINICAL DATA: Low back pain for the past 5 years.

EXAM:
MRI LUMBAR SPINE WITHOUT CONTRAST
TECHNIQUE: Multiplanar, multisequence MR imaging of the lumbar spine was
performed. No intravenous contrast was administered.

[Series 2: T2 · sagittal · 4.0mm · 0.81mm/px · 6 of 15 slices shown (1 of 2)]
[im 1/15]
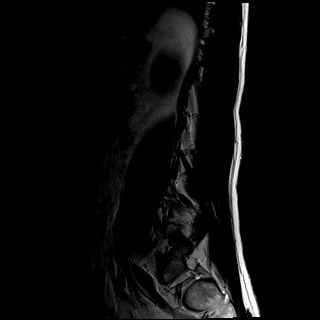
[im 3/15]
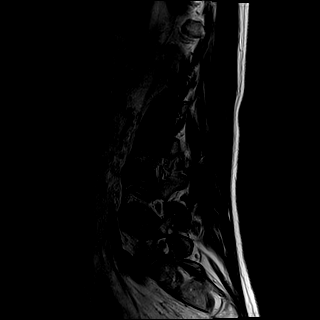
[im 6/15]
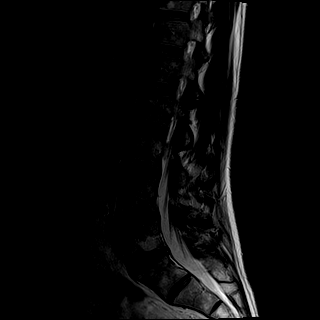
[im 9/15]
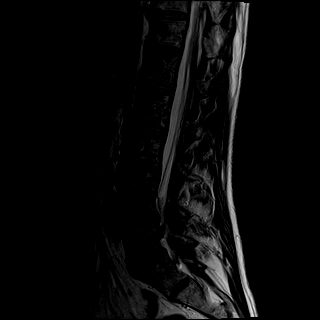
[im 12/15]
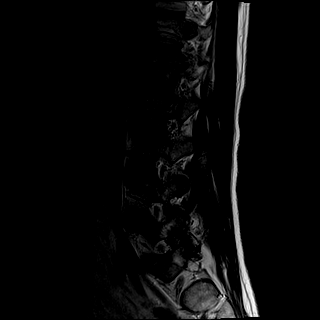
[im 15/15]
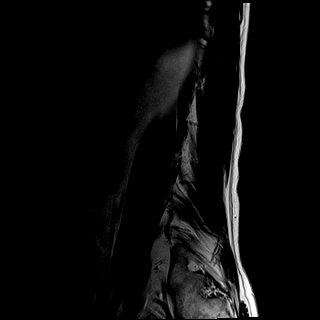

[Series 3: STIR · sagittal · 4.0mm · 1.02mm/px · 6 of 15 slices shown]
[im 1/15]
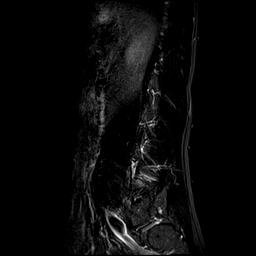
[im 3/15]
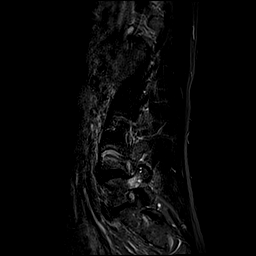
[im 6/15]
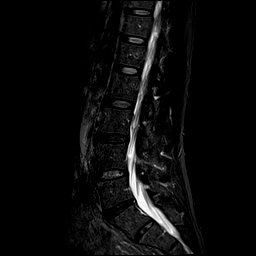
[im 9/15]
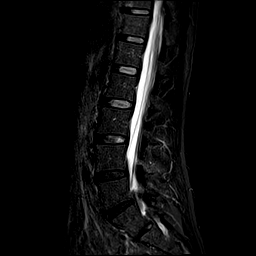
[im 12/15]
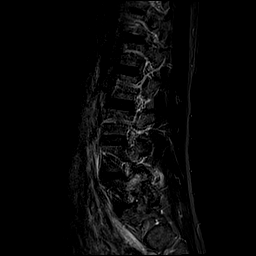
[im 15/15]
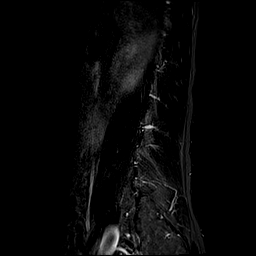

[Series 4: T1 · sagittal · 4.0mm · 0.81mm/px · 6 of 15 slices shown (1 of 2)]
[im 1/15]
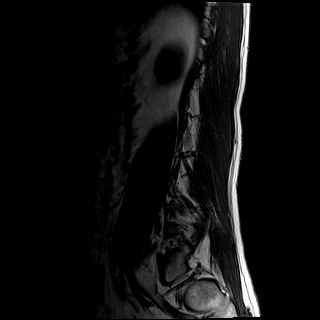
[im 3/15]
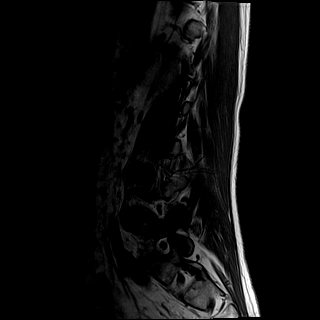
[im 6/15]
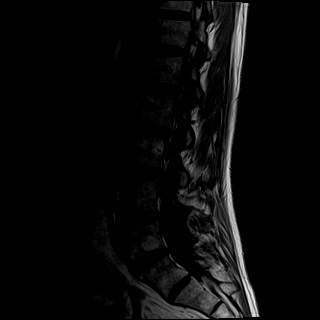
[im 9/15]
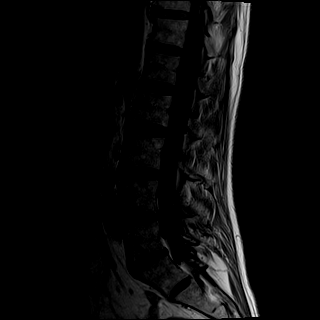
[im 12/15]
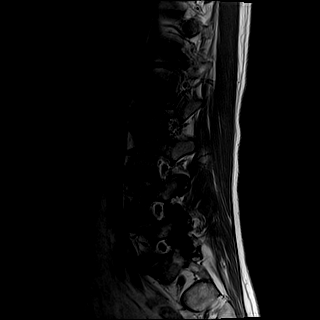
[im 15/15]
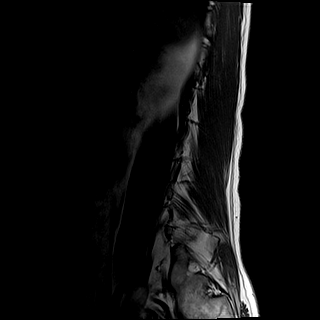

[Series 5: T2 · axial · 4.0mm · 0.78mm/px · z∈[-76,+141]mm · 10 of 38 slices shown (2 of 2)]
[im 1/38]
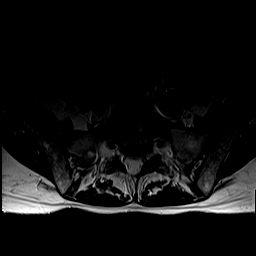
[im 3/38]
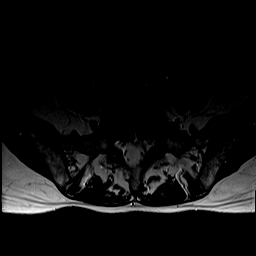
[im 6/38]
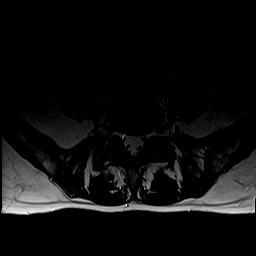
[im 11/38]
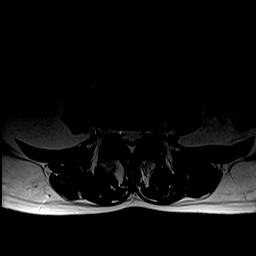
[im 16/38]
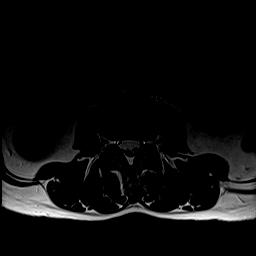
[im 19/38]
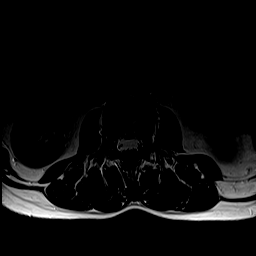
[im 22/38]
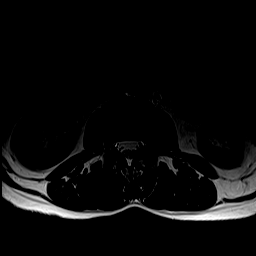
[im 27/38]
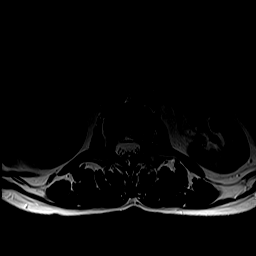
[im 32/38]
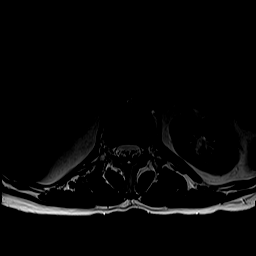
[im 38/38]
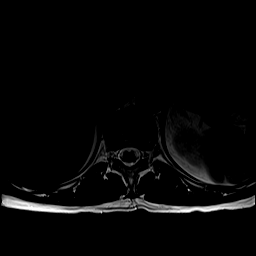

[Series 6: T1 · axial · 4.0mm · 0.39mm/px · z∈[-76,+141]mm · 9 of 38 slices shown (2 of 2)]
[im 1/38]
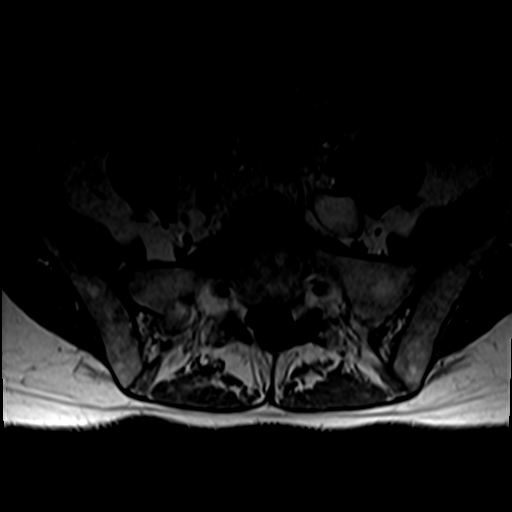
[im 6/38]
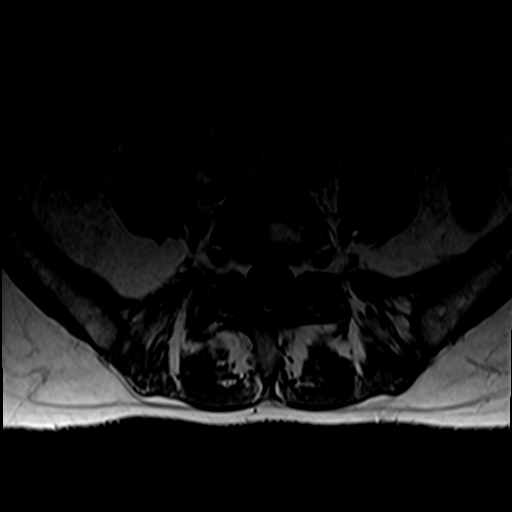
[im 11/38]
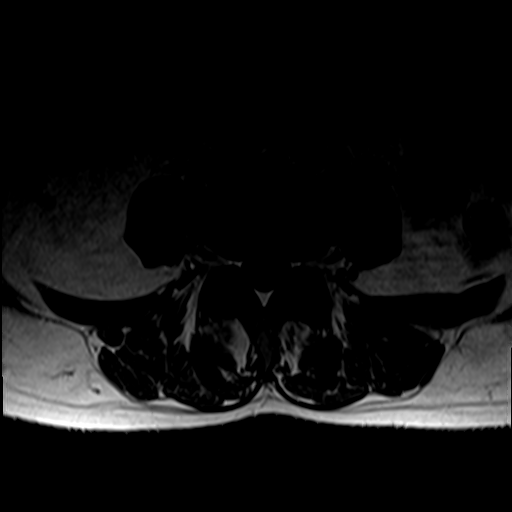
[im 16/38]
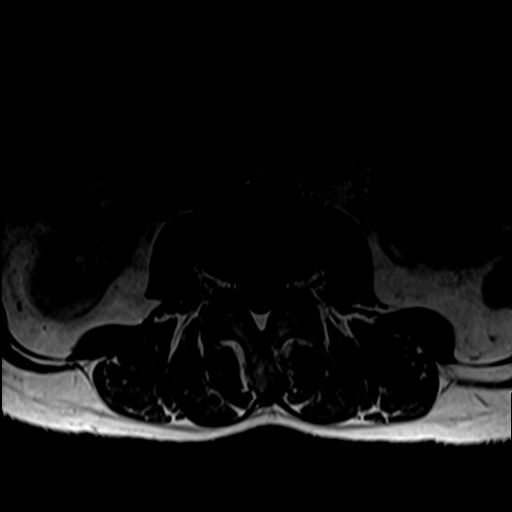
[im 19/38]
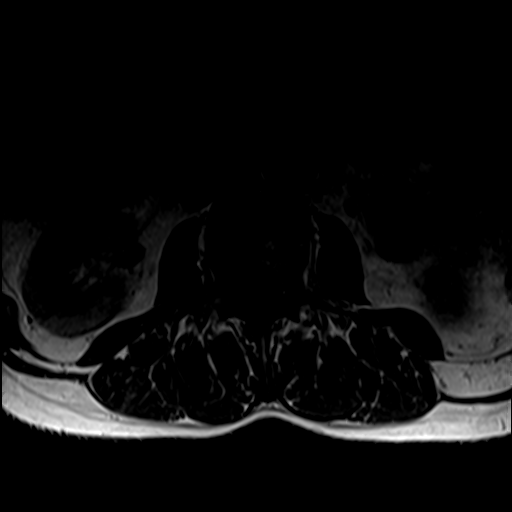
[im 22/38]
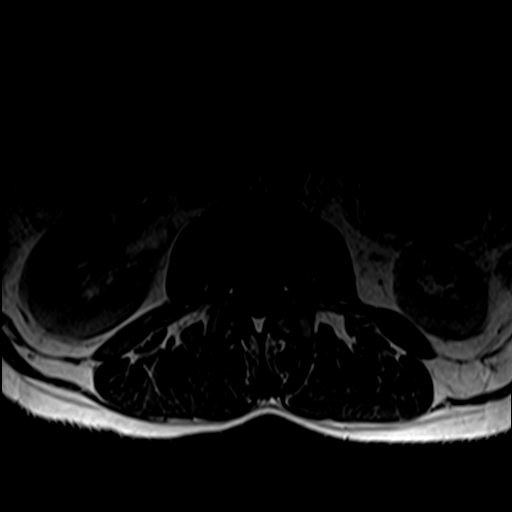
[im 27/38]
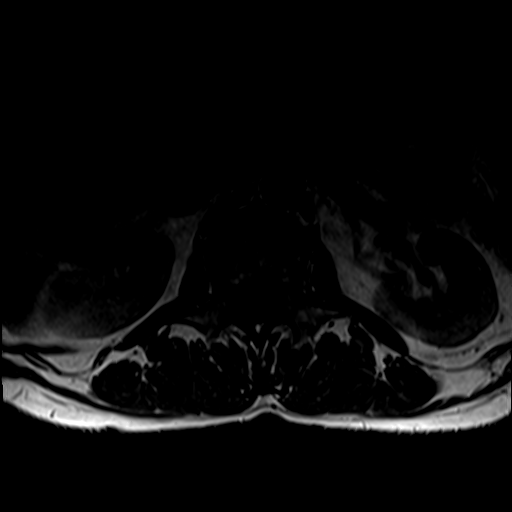
[im 32/38]
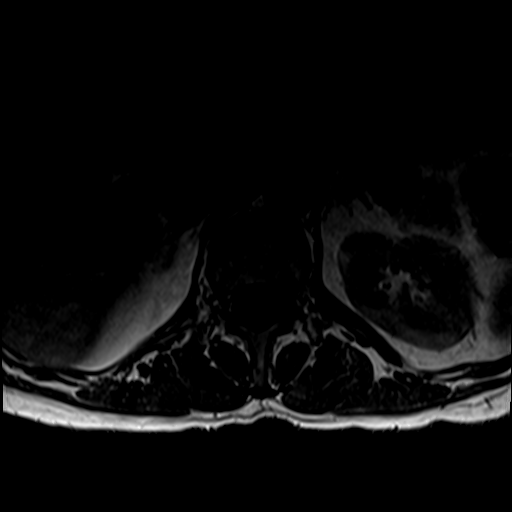
[im 38/38]
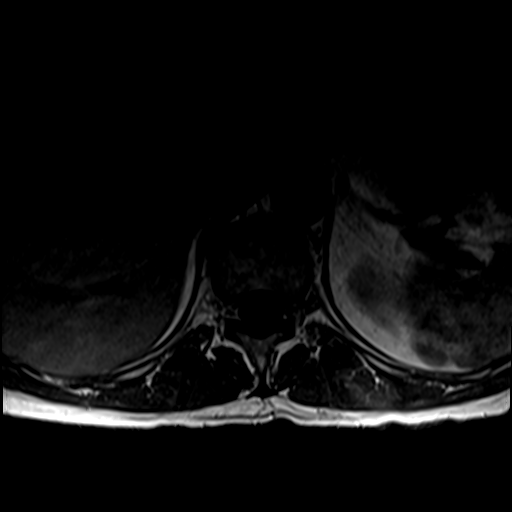

[37 of 48 positions shown; findings below may reference images not displayed]

FINDINGS: Segmentation: Transitional lumbosacral anatomy with partial
lumbarization of the S1 vertebral body. The last well-formed disc
space is labeled S1-S2 for the purposes of this report.

Alignment:  Physiologic.

Vertebrae: No fracture, evidence of discitis, or bone lesion.
Bilateral left-greater-than-right facet edema at L4-L5.

Conus medullaris: Extends to the L2 level and appears normal.

Paraspinal and other soft tissues: Negative.

Disc levels:

T11-T12:  Negative.

T12-L1:  Negative.

L1-L2:  Negative.

L2-L3:  Negative.

L3-L4:  Negative.

L4-L5: Moderate bilateral facet arthropathy. No spinal canal or
neuroforaminal stenosis.

L5-S1: Small diffuse disc bulge with annular fissure and mild
bilateral facet arthropathy, resulting in mild right neuroforaminal
stenosis. No significant central spinal canal or left neuroforaminal
stenosis.
IMPRESSION: 1. Transitional lumbosacral anatomy with partial lumbarization of
S1. The last well-formed disc space is labeled S1-S2 for the
purposes of this report. Correlation with radiographs is recommended
prior to any operative intervention.
2. Moderate bilateral facet arthropathy at L4-L5 with prominent
degenerative marrow edema.
3. Mild degenerative disc disease and and facet arthropathy at L5-S1
resulting in mild right neuroforaminal stenosis.

## 2019-03-25 DIAGNOSIS — D225 Melanocytic nevi of trunk: Secondary | ICD-10-CM | POA: Diagnosis not present

## 2019-03-25 DIAGNOSIS — Z1283 Encounter for screening for malignant neoplasm of skin: Secondary | ICD-10-CM | POA: Diagnosis not present

## 2019-03-25 DIAGNOSIS — D2261 Melanocytic nevi of right upper limb, including shoulder: Secondary | ICD-10-CM | POA: Diagnosis not present

## 2019-03-25 DIAGNOSIS — D2262 Melanocytic nevi of left upper limb, including shoulder: Secondary | ICD-10-CM | POA: Diagnosis not present

## 2019-04-06 DIAGNOSIS — D225 Melanocytic nevi of trunk: Secondary | ICD-10-CM | POA: Diagnosis not present

## 2019-04-06 DIAGNOSIS — D2261 Melanocytic nevi of right upper limb, including shoulder: Secondary | ICD-10-CM | POA: Diagnosis not present

## 2019-04-06 DIAGNOSIS — Z1283 Encounter for screening for malignant neoplasm of skin: Secondary | ICD-10-CM | POA: Diagnosis not present

## 2019-04-06 DIAGNOSIS — D2262 Melanocytic nevi of left upper limb, including shoulder: Secondary | ICD-10-CM | POA: Diagnosis not present

## 2019-05-19 ENCOUNTER — Encounter: Payer: Self-pay | Admitting: Radiology

## 2019-05-26 ENCOUNTER — Encounter: Payer: Self-pay | Admitting: Family Medicine

## 2019-05-26 MED ORDER — MELOXICAM 15 MG PO TABS: 15.0000 mg | ORAL_TABLET | Freq: Every day | ORAL | 3 refills | Status: DC

## 2019-07-31 ENCOUNTER — Encounter: Payer: Self-pay | Admitting: Family Medicine

## 2019-08-04 NOTE — Progress Notes (Signed)
Coloscopy? Mammogram 08/2018- normal  ? Pap

## 2019-08-05 ENCOUNTER — Encounter: Payer: Self-pay | Admitting: Family Medicine

## 2019-08-05 ENCOUNTER — Other Ambulatory Visit: Payer: Self-pay

## 2019-08-05 ENCOUNTER — Ambulatory Visit (INDEPENDENT_AMBULATORY_CARE_PROVIDER_SITE_OTHER): Payer: Federal, State, Local not specified - PPO | Admitting: Family Medicine

## 2019-08-05 VITALS — BP 120/72 | HR 102 | Ht 64.0 in | Wt 126.0 lb

## 2019-08-05 DIAGNOSIS — E8940 Asymptomatic postprocedural ovarian failure: Secondary | ICD-10-CM

## 2019-08-05 DIAGNOSIS — Z720 Tobacco use: Secondary | ICD-10-CM | POA: Diagnosis not present

## 2019-08-05 DIAGNOSIS — E559 Vitamin D deficiency, unspecified: Secondary | ICD-10-CM | POA: Diagnosis not present

## 2019-08-05 DIAGNOSIS — Z01419 Encounter for gynecological examination (general) (routine) without abnormal findings: Secondary | ICD-10-CM

## 2019-08-05 MED ORDER — BUPROPION HCL 75 MG PO TABS: 75.0000 mg | ORAL_TABLET | Freq: Two times a day (BID) | ORAL | 5 refills | Status: DC

## 2019-08-05 NOTE — Assessment & Plan Note (Addendum)
Will attempt WellButrin. Smoking and tobacco cessation was discussed at today's visit for 6 minutes   Congratulations on wanting to quit smoking!  Here are some suggested steps to help make this process easier: 1. Set a quit date - usually 2-4 wks in the future - gives you time to mentally prepare 2. Enlist the help of someone - it is easier with a cheerleader 3. Think about your triggers - do you like to have one after meals, first thing in the morning, etc. 4. Write down a list of alternatives to smoking - hopefully not involving consuming more calories     A. Making a quick phone call - bonus, reconnecting with someone you care about     B. Chewing a piece of sugar-free gum     C. Drinking a large glass of water - bonus, you need to stay hydrated      D. Taking a 10 minute walk - bonus, getting healthy, burning calories, improves mood 5. Put the list where you can see it and quickly use it when the cravings hit 6. Write a list of why you should quit     A. Your breath will smell better     B. Your lungs will thank you - they start to improve very quickly     C. Your pocketbook will thank you - actually write down what you spend on cigarettes/wk or month and start saving that money--at a bank or at home in a jar. Do the quick math, and you can probably save enough money in 1 year for a cool vacation or something special you have your eye on.     D. Insert your reasons here 7. Post this list where you can see it if your motivation wanes 8. Consider reducing how many cigarettes you smoke daily. Reduce the number by 1 everyday until the quit date arrives.     Example: You smoke 10/day, then tomorrow, you are only allowed 9/day for a couple of days, then 8/day and so on. By the time you get to your quit date, hopefully, you are down to < 5/day. Once you are down to this number, you are no longer physically addicted, you just have a habit. It is a hard habit to break, but I know you can do  this!!! I hope this helps. Let me know if you have other questions.

## 2019-08-05 NOTE — Progress Notes (Signed)
Having vaginal dryness. 

## 2019-08-05 NOTE — Assessment & Plan Note (Signed)
Advised trial of Intrarosa--she will consider this.

## 2019-08-05 NOTE — Progress Notes (Signed)
Subjective:     Beverly Cook is a 54 y.o. female and is here for a comprehensive physical exam. The patient reports problems - vagainal dryness. Has hot flashes. Tried patch and estrogen pills and vaginal cream and it didn't work as well. Not interested in treatment. S/p TAH/BSO in past. 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 Interested in smoking cessation.   The following portions of the patient's history were reviewed and updated as appropriate: allergies, current medications, past family history, past medical history, past social history, past surgical history and problem list.  Review of Systems Pertinent items noted in HPI and remainder of comprehensive ROS otherwise negative.   Objective:    BP 120/72   Pulse (!) 102   Ht 5\' 4"  (1.626 m)   Wt 126 lb (57.2 kg)   BMI 21.63 kg/m  General appearance: alert, cooperative and appears stated age Head: Normocephalic, without obvious abnormality, atraumatic Neck: no adenopathy, supple, symmetrical, trachea midline and thyroid not enlarged, symmetric, no tenderness/mass/nodules Lungs: clear to auscultation bilaterally Breasts: normal appearance, no masses or tenderness, bilateral implants in place Heart: regular rate and rhythm, S1, S2 normal, no murmur, click, rub or gallop Abdomen: soft, non-tender; bowel sounds normal; no masses,  no organomegaly Extremities: extremities normal, atraumatic, no cyanosis or edema Pulses: 2+ and symmetric Skin: Skin color, texture, turgor normal. No rashes or lesions Lymph nodes: Cervical, supraclavicular, and axillary nodes normal. Neurologic: Grossly normal    Assessment/Plan:    GYN female exam.       Problem List Items Addressed This Visit      Unprioritized   Tobacco user    Will attempt WellButrin. Smoking and tobacco cessation was discussed at today's visit for 6 minutes   Congratulations on wanting to quit smoking!  Here are some suggested steps to help make this  process easier: 1. Set a quit date - usually 2-4 wks in the future - gives you time to mentally prepare 2. Enlist the help of someone - it is easier with a cheerleader 3. Think about your triggers - do you like to have one after meals, first thing in the morning, etc. 4. Write down a list of alternatives to smoking - hopefully not involving consuming more calories     A. Making a quick phone call - bonus, reconnecting with someone you care about     B. Chewing a piece of sugar-free gum     C. Drinking a large glass of water - bonus, you need to stay hydrated      D. Taking a 10 minute walk - bonus, getting healthy, burning calories, improves mood 5. Put the list where you can see it and quickly use it when the cravings hit 6. Write a list of why you should quit     A. Your breath will smell better     B. Your lungs will thank you - they start to improve very quickly     C. Your pocketbook will thank you - actually write down what you spend on cigarettes/wk or month and start saving that money--at a bank or at home in a jar. Do the quick math, and you can probably save enough money in 1 year for a cool vacation or something special you have your eye on.     D. Insert your reasons here 7. Post this list where you can see it if your motivation wanes 8. Consider reducing how many cigarettes you smoke daily. Reduce the  number by 1 everyday until the quit date arrives.     Example: You smoke 10/day, then tomorrow, you are only allowed 9/day for a couple of days, then 8/day and so on. By the time you get to your quit date, hopefully, you are down to < 5/day. Once you are down to this number, you are no longer physically addicted, you just have a habit. It is a hard habit to break, but I know you can do this!!! I hope this helps. Let me know if you have other questions.       Relevant Medications   buPROPion (WELLBUTRIN) 75 MG tablet   Surgical menopause    Advised trial of Intrarosa--she will  consider this.      Vitamin D deficiency    Will check levels with PCP       Other Visit Diagnoses    Encounter for gynecological examination without abnormal finding    -  Primary   Relevant Orders   MM 3D SCREEN BREAST W/IMPLANT BILATERAL     Return in 1 year (on 08/04/2020).  See After Visit Summary for Counseling Recommendations

## 2019-08-05 NOTE — Assessment & Plan Note (Signed)
Will check levels with PCP

## 2019-08-05 NOTE — Patient Instructions (Signed)

## 2019-08-09 DIAGNOSIS — Z720 Tobacco use: Secondary | ICD-10-CM

## 2019-08-09 MED ORDER — BUPROPION HCL ER (SR) 150 MG PO TB12: 150.0000 mg | ORAL_TABLET | Freq: Two times a day (BID) | ORAL | 2 refills | Status: DC

## 2019-08-26 ENCOUNTER — Ambulatory Visit (INDEPENDENT_AMBULATORY_CARE_PROVIDER_SITE_OTHER): Payer: Federal, State, Local not specified - PPO | Admitting: Family Medicine

## 2019-08-26 ENCOUNTER — Other Ambulatory Visit: Payer: Self-pay

## 2019-08-26 ENCOUNTER — Encounter: Payer: Self-pay | Admitting: Family Medicine

## 2019-08-26 VITALS — BP 94/56 | HR 74 | Temp 97.7°F | Ht 64.5 in | Wt 125.8 lb

## 2019-08-26 DIAGNOSIS — E041 Nontoxic single thyroid nodule: Secondary | ICD-10-CM | POA: Diagnosis not present

## 2019-08-26 DIAGNOSIS — E782 Mixed hyperlipidemia: Secondary | ICD-10-CM | POA: Diagnosis not present

## 2019-08-26 DIAGNOSIS — E559 Vitamin D deficiency, unspecified: Secondary | ICD-10-CM | POA: Diagnosis not present

## 2019-08-26 DIAGNOSIS — Z0000 Encounter for general adult medical examination without abnormal findings: Secondary | ICD-10-CM

## 2019-08-26 DIAGNOSIS — Z720 Tobacco use: Secondary | ICD-10-CM

## 2019-08-26 LAB — CBC
HCT: 43.3 % (ref 36.0–46.0)
Hemoglobin: 14.8 g/dL (ref 12.0–15.0)
MCHC: 34.1 g/dL (ref 30.0–36.0)
MCV: 87.4 fl (ref 78.0–100.0)
Platelets: 206 10*3/uL (ref 150.0–400.0)
RBC: 4.96 Mil/uL (ref 3.87–5.11)
RDW: 12.6 % (ref 11.5–15.5)
WBC: 4.7 10*3/uL (ref 4.0–10.5)

## 2019-08-26 LAB — COMPREHENSIVE METABOLIC PANEL
ALT: 22 U/L (ref 0–35)
AST: 19 U/L (ref 0–37)
Albumin: 4.4 g/dL (ref 3.5–5.2)
Alkaline Phosphatase: 61 U/L (ref 39–117)
BUN: 14 mg/dL (ref 6–23)
CO2: 34 mEq/L — ABNORMAL HIGH (ref 19–32)
Calcium: 9.6 mg/dL (ref 8.4–10.5)
Chloride: 104 mEq/L (ref 96–112)
Creatinine, Ser: 0.93 mg/dL (ref 0.40–1.20)
GFR: 62.37 mL/min (ref >60.00)
Glucose, Bld: 82 mg/dL (ref 70–99)
Potassium: 4.3 mEq/L (ref 3.5–5.1)
Sodium: 143 mEq/L (ref 135–145)
Total Bilirubin: 1.2 mg/dL (ref 0.2–1.2)
Total Protein: 7 g/dL (ref 6.0–8.3)

## 2019-08-26 LAB — TSH: TSH: 0.63 u[IU]/mL (ref 0.35–4.50)

## 2019-08-26 LAB — VITAMIN D 25 HYDROXY (VIT D DEFICIENCY, FRACTURES): VITD: 39.02 ng/mL (ref 30.00–100.00)

## 2019-08-26 LAB — LIPID PANEL
Cholesterol: 211 mg/dL — ABNORMAL HIGH (ref 0–200)
HDL: 68.4 mg/dL (ref >39.00)
LDL Cholesterol: 121 mg/dL — ABNORMAL HIGH (ref 0–99)
NonHDL: 143.01
Total CHOL/HDL Ratio: 3
Triglycerides: 112 mg/dL (ref 0.0–149.0)
VLDL: 22.4 mg/dL (ref 0.0–40.0)

## 2019-08-26 NOTE — Progress Notes (Signed)
Annual Exam   Chief Complaint:  Chief Complaint  Patient presents with  . Annual Exam    has gyn provider    History of Present Illness:  [name] is a 54 y.o. G2P2 who LMP was No LMP recorded. Patient has had a hysterectomy., presents today for her annual examination.    #tobacco use - on welbutrin - quit date of 08/31/2019 - side effects - some dazed symptoms   Nutrition She does get adequate calcium and Vitamin D in her diet. Diet: salad w/ chicken + egg, grilled chicken, thinks pretty healthy. Brings food to work, pizza once a week Exercise: walks, tennis, biking -- when the weather is nice, hiking on the weekend   Safety The patient wears seatbelts: yes.     The patient feels safe at home and in their relationships: yes.   Menstrual S/p hysterectomy  GYN She is single partner, contraception - status post hysterectomy.  Low libido  Vaginal dryness - using lubricant with improvement Has tried vaginal estrogen w/o improvement  Cervical Cancer Screening:   No longer has cervix  Breast Cancer Screening There is yes FH of breast cancer. There is no FH of ovarian cancer. BRCA screening Not Indicated.  Last Mammogram: 08/17/2018 The patient does want a mammogram this year.    Colon Cancer Screening Age 80-75 yo - benefits outweigh the risk. Adults 33-85 yo who have never been screened benefit.  Benefits: 134000 people in 2016 will be diagnosed and 49,000 will die - early detection helps [place]: Complications 2/2 to colonoscopy High Risk (Colonoscopy): genetic disorder (Lynch syndrome or familial adenomatous polyposis), personal hx of IBD, previous adenomatous polyp, or previous colorectal cancer, FamHx start 10 years before the age at diagnosis, increased in males and black race  Options:  FIT - looks for hemoglobin (blood in the stool) - specific and fairly sensitive - must be done annually Cologuard - looks for DNA and blood - more sensitive - therefore can  have more false positives, every 3 years Colonoscopy - every 10 years if normal - sedation, bowl prep, must have someone drive you  Up to date on screening   Lung Cancer Screening Annual screening for adults age 50-80 yo with 30 year pack history? No Current Tobacco user? Yes Quit less than 15 years ago? No Interested in low dose CT for lung cancer screening? not applicable  Social History   Tobacco Use  Smoking Status Current Every Day Smoker  . Packs/day: 0.25  . Years: 30.00  . Pack years: 7.50  . Types: Cigarettes  Smokeless Tobacco Never Used     Weight Wt Readings from Last 3 Encounters:  08/26/19 125 lb 12 oz (57 kg)  08/05/19 126 lb (57.2 kg)  12/21/18 122 lb (55.3 kg)   Patient has normal BMI  BMI Readings from Last 1 Encounters:  08/26/19 21.25 kg/m     Chronic disease screening Blood pressure monitoring:  BP Readings from Last 3 Encounters:  08/26/19 (!) 94/56  08/05/19 120/72  12/21/18 109/67    Lipid Monitoring: Indication for screening: age >67, obesity, diabetes, family hx, CV risk factors.  Lipid screening: Yes  Lab Results  Component Value Date   CHOL 169 10/20/2017   HDL 66 10/20/2017   LDLCALC 86 10/20/2017   TRIG 86 10/20/2017   CHOLHDL 2.6 10/20/2017     Diabetes Screening: age >71, overweight, family hx, PCOS, hx of gestational diabetes, at risk ethnicity Diabetes Screening screening: Yes  Lab Results  Component Value Date   HGBA1C 5.3 07/17/2017     Past Medical History:  Diagnosis Date  . Depression   . Diverticulosis   . Hyperthyroidism   . Hyperthyroidism   . Lesion of rectum   . Osteopenia   . Seasonal affective disorder ([place])   . Thyroid disease    HYPOTHYRIODISM /GOITER    Past Surgical History:  Procedure Laterality Date  . ABDOMINAL HYSTERECTOMY  2009   T [facility]  . AUGMENTATION MAMMAPLASTY Bilateral 2005  . BILATERAL SALPINGOOPHORECTOMY    . BREAST ENHANCEMENT SURGERY Bilateral 05/2003  . BREAST SURGERY   2004   BREAST AUGMENTATION  . COLONOSCOPY    . FLEXIBLE SIGMOIDOSCOPY N/A 06/30/2015   Procedure: FLEXIBLE SIGMOIDOSCOPY;  Surgeon: Lollie Sails, MD;  Location[facility] ENDOSCOPY;  Service: Endoscopy;  Laterality: N/A;  . FLEXIBLE SIGMOIDOSCOPY N/A 09/05/2016   Procedure: FLEXIBLE SIGMOIDOSCOPY;  Surgeon: Lollie Sails, MD;  Location[facility] ENDOSCOPY;  Service: Endoscopy;  Laterality: N/A;  . FLEXIBLE SIGMOIDOSCOPY N/A 12/21/2018   Procedure: FLEXIBLE SIGMOIDOSCOPY;  Surgeon: Lollie Sails, MD;  Location: [facility] ENDOSCOPY;  Service: Endoscopy;  Laterality: N/A;  . RECTAL SURGERY  2017  . WISDOM TOOTH EXTRACTION      Prior to Admission medications   Medication Sig Start Date End Date Taking? Authorizing Provider  buPROPion (WELLBUTRIN SR) 150 MG 12 hr tablet Take 1 tablet (150 mg total) by mouth 2 (two) times daily. Patient taking differently: Take 150 mg by mouth 2 (two) times daily. Taking once daily for now 08/09/19  Yes Donnamae Jude, MD  Calcium Carb-Cholecalciferol (HM CALCIUM-VITAMIN D PO) Take by mouth. 600-20 mcg 1 daily   Yes [provider]  cyclobenzaprine (FLEXERIL) 5 MG tablet Take 1 tablet (5 mg total) by mouth 3 (three) times daily as needed for muscle spasms. 08/17/18  Yes Lesleigh Noe, MD    No Known Allergies  Gynecologic History: No LMP recorded. Patient has had a hysterectomy.  Obstetric History: G2P2  Social History   Socioeconomic History  . Marital status: Married    Spouse name: [name]  . Number of children: 2  . Years of education: bachelors  . Highest education level: Not on file  Occupational History  . Not on file  Tobacco Use  . Smoking status: Current Every Day Smoker    Packs/day: 0.25    Years: 30.00    Pack years: 7.50    Types: Cigarettes  . Smokeless tobacco: Never Used  Substance and Sexual Activity  . Alcohol use: Yes    Alcohol/week: 1.0 standard drinks    Types: 1 Glasses of wine per week    Comment: occasional-once a  week, 3 beers  . Drug use: No  . Sexual activity: Yes    Partners: Male    Birth control/protection: Surgical  Other Topics Concern  . Not on file  Social History Narrative   Lives with [name]   Has 2 children - Crystal and Williams Che has 3 children   Enjoys: bicycle, tennis, flipping housing   Exercise: goes to the [employer] 2-3 days a week   Diet: pretty good overall, does eat junk food   Social Determinants of Health   Financial Resource Strain:   . Difficulty of Paying Living Expenses:   Food Insecurity:   . Worried About Charity fundraiser in the Last Year:   . [place] in the Last Year:   Transportation Needs:   . Lack of  Transportation (Medical):   Marland Kitchen Lack of Transportation (Non-Medical):   Physical Activity:   . Days of Exercise per Week:   . Minutes of Exercise per Session:   Stress:   . Feeling of Stress :   Social Connections:   . Frequency of Communication with Friends and Family:   . Frequency of Social Gatherings with Friends and Family:   . Attends Religious Services:   . Active Member of Clubs or Organizations:   . Attends Archivist Meetings:   Marland Kitchen Marital Status:   Intimate Partner Violence:   . Fear of Current or Ex-Partner:   . Emotionally Abused:   Marland Kitchen Physically Abused:   . Sexually Abused:     Family History  Problem Relation Age of Onset  . Hypertension Mother   . Hyperlipidemia Mother   . Hypertension Father   . Hyperlipidemia Father   . Melanoma Father   . Breast cancer Paternal Aunt 9  . Diabetes Maternal Grandmother   . Breast cancer Paternal Aunt 21  . Breast cancer Cousin 3  . Breast cancer Paternal Aunt 46  . Breast cancer Cousin 32  . Healthy Sister   . Healthy Brother   . Healthy Brother     Review of Systems  Constitutional: Negative for chills and fever.  HENT: Negative for congestion and sore throat.   Eyes: Negative for blurred vision and double vision.  Respiratory: Negative for shortness of breath.    Cardiovascular: Negative for chest pain.  Gastrointestinal: Negative for heartburn, nausea and vomiting.       Some red stools after eating beets  Genitourinary: Negative.   Musculoskeletal: Negative.  Negative for myalgias.  Skin: Negative for rash.  Neurological: Negative for dizziness and headaches.  Endo/Heme/Allergies: Does not bruise/bleed easily.  Psychiatric/Behavioral: Negative for depression. The patient is not nervous/anxious.      Physical Exam BP (!) 94/56   Pulse 74   Temp 97.7 F (36.5 C)   Ht 5' 4.5" (1.638 m)   Wt 125 lb 12 oz (57 kg)   SpO2 98%   BMI 21.25 kg/m    BP Readings from Last 3 Encounters:  08/26/19 (!) 94/56  08/05/19 120/72  12/21/18 109/67      Physical Exam Constitutional:      General: She is not in acute distress.    Appearance: She is well-developed. She is not diaphoretic.  HENT:     Head: Normocephalic and atraumatic.     Right Ear: External ear normal.     Left Ear: External ear normal.     Nose: Nose normal.  Eyes:     General: No scleral icterus.    Conjunctiva/sclera: Conjunctivae normal.  Cardiovascular:     Rate and Rhythm: Normal rate and regular rhythm.     Heart sounds: No murmur.  Pulmonary:     Effort: Pulmonary effort is normal. No respiratory distress.     Breath sounds: Normal breath sounds. No wheezing.  Abdominal:     General: Bowel sounds are normal. There is no distension.     Palpations: Abdomen is soft. There is no mass.     Tenderness: There is no abdominal tenderness. There is no guarding or rebound.  Musculoskeletal:        General: Normal range of motion.     Cervical back: Neck supple.  Lymphadenopathy:     Cervical: No cervical adenopathy.  Skin:    General: Skin is warm and dry.  Capillary Refill: Capillary refill takes less than 2 seconds.  Neurological:     Mental Status: She is alert and oriented to person, place, and time.     Deep Tendon Reflexes: Reflexes normal.  Psychiatric:         Behavior: Behavior normal.       Results:  PHQ-9:    Office Visit from 08/17/2018 in [place] [facility]  PHQ-9 Total Score  1        Assessment: 54 y.o. G2P2 female here for routine annual physical examination.  Plan: Problem List Items Addressed This Visit      Endocrine   Thyroid nodule   Relevant Orders   TSH     Other   Tobacco user   HLD (hyperlipidemia)   Relevant Orders   Lipid panel   Vitamin D deficiency   Relevant Orders   Vitamin D, 25-hydroxy    Other Visit Diagnoses    Annual physical exam    -  Primary   Relevant Orders   CBC   Comprehensive metabolic panel      Screening: -- Blood pressure screen normal -- cholesterol screening: will obtain -- Weight screening: normal -- Diabetes Screening: not due for screening -- Nutrition: normal - encouraged continued healthy diet  The 10-year ASCVD risk score Mikey Bussing DC Jr., et al., 2013) is: 1.9%   Values used to calculate the score:     Age: 6 years     Sex: Female     Is Non-Hispanic African American: No     Diabetic: No     Tobacco smoker: Yes     Systolic Blood Pressure: 94 mmHg     Is BP treated: No     HDL Cholesterol: 66 mg/dL     Total Cholesterol: 169 mg/dL  -- Statin therapy for Age 29-75 with CVD risk >7.5%  Psych -- Depression screening (PHQ-9):    Office Visit from 08/17/2018 in [place] at [facility]  PHQ-9 Total Score  1       Safety -- tobacco screening: working on quitting with welbutrin -- alcohol screening:  low-risk usage. -- no evidence of domestic violence or intimate partner violence.   Cancer Screening -- pap smear not collected per ASCCP guidelines -- family history of breast cancer screening: done. not at high risk. -- Mammogram - scheduled for May  Immunizations -- flu vaccine up to date -- TDAP q10 years up to date   Encouraged smoking cession and healthy life style. Regular vision/dental    Lesleigh Noe, MD

## 2019-08-26 NOTE — Patient Instructions (Addendum)
Curcumin or Tumeric  Research has shown that Curcumin (Tumeric) supplements are just as good as high dose anti-inflammatory medications (like diclofenac and ibuprofen) at treating pain from osteoarthritis without the side effects.   Take Curcumin 500 mg Three times daily   -- You can find a bottle at [employer] for $8 which should last a month -- Bandera is around $9 and is available at [facility]   Preventive Care 37-56 Years Old, Female Preventive care refers to visits with your health care provider and lifestyle choices that can promote health and wellness. This includes:  A yearly physical exam. This may also be called an annual well check.  Regular dental visits and eye exams.  Immunizations.  Screening for certain conditions.  Healthy lifestyle choices, such as eating a healthy diet, getting regular exercise, not using drugs or products that contain nicotine and tobacco, and limiting alcohol use. What can I expect for my preventive care visit? Physical exam Your health care provider will check your:  Height and weight. This may be used to calculate body mass index (BMI), which tells if you are at a healthy weight.  Heart rate and blood pressure.  Skin for abnormal spots. Counseling Your health care provider may ask you questions about your:  Alcohol, tobacco, and drug use.  Emotional well-being.  Home and relationship well-being.  Sexual activity.  Eating habits.  Work and work Statistician.  Method of birth control.  Menstrual cycle.  Pregnancy history. What immunizations do I need?  Influenza (flu) vaccine  This is recommended every year. Tetanus, diphtheria, and pertussis (Tdap) vaccine  You may need a Td booster every 10 years. Varicella (chickenpox) vaccine  You may need this if you have not been vaccinated. Zoster (shingles) vaccine  You may need this after age 10. Measles, mumps, and rubella (MMR) vaccine  You may need at least one  dose of MMR if you were born in 1957 or later. You may also need a second dose. Pneumococcal conjugate (PCV13) vaccine  You may need this if you have certain conditions and were not previously vaccinated. Pneumococcal polysaccharide (PPSV23) vaccine  You may need one or two doses if you smoke cigarettes or if you have certain conditions. Meningococcal conjugate (MenACWY) vaccine  You may need this if you have certain conditions. Hepatitis A vaccine  You may need this if you have certain conditions or if you travel or work in places where you may be exposed to hepatitis A. Hepatitis B vaccine  You may need this if you have certain conditions or if you travel or work in places where you may be exposed to hepatitis B. Haemophilus influenzae type b (Hib) vaccine  You may need this if you have certain conditions. Human papillomavirus (HPV) vaccine  If recommended by your health care provider, you may need three doses over 6 months. You may receive vaccines as individual doses or as more than one vaccine together in one shot (combination vaccines). Talk with your health care provider about the risks and benefits of combination vaccines. What tests do I need? Blood tests  Lipid and cholesterol levels. These may be checked every 5 years, or more frequently if you are over 89 years old.  Hepatitis C test.  Hepatitis B test. Screening  Lung cancer screening. You may have this screening every year starting at age 27 if you have a 30-pack-year history of smoking and currently smoke or have quit within the past 15 years.  Colorectal cancer screening.  All adults should have this screening starting at age 41 and continuing until age 8. Your health care provider may recommend screening at age 58 if you are at increased risk. You will have tests every 1-10 years, depending on your results and the type of screening test.  Diabetes screening. This is done by checking your blood sugar (glucose)  after you have not eaten for a while (fasting). You may have this done every 1-3 years.  Mammogram. This may be done every 1-2 years. Talk with your health care provider about when you should start having regular mammograms. This may depend on whether you have a family history of breast cancer.  BRCA-related cancer screening. This may be done if you have a family history of breast, ovarian, tubal, or peritoneal cancers.  Pelvic exam and Pap test. This may be done every 3 years starting at age 58. Starting at age 33, this may be done every 5 years if you have a Pap test in combination with an HPV test. Other tests  Sexually transmitted disease (STD) testing.  Bone density scan. This is done to screen for osteoporosis. You may have this scan if you are at high risk for osteoporosis. Follow these instructions at home: Eating and drinking  Eat a diet that includes fresh fruits and vegetables, whole grains, lean protein, and low-fat dairy.  Take vitamin and mineral supplements as recommended by your health care provider.  Do not drink alcohol if: ? Your health care provider tells you not to drink. ? You are pregnant, may be pregnant, or are planning to become pregnant.  If you drink alcohol: ? Limit how much you have to 0-1 drink a day. ? Be aware of how much alcohol is in your drink. In the [place] one drink equals one 12 oz bottle of beer (355 mL), one 5 oz glass of wine (148 mL), or one 1 oz glass of hard liquor (44 mL). Lifestyle  Take daily care of your teeth and gums.  Stay active. Exercise for at least 30 minutes on 5 or more days each week.  Do not use any products that contain nicotine or tobacco, such as cigarettes, e-cigarettes, and chewing tobacco. If you need help quitting, ask your health care provider.  If you are sexually active, practice safe sex. Use a condom or other form of birth control (contraception) in order to prevent pregnancy and STIs (sexually transmitted  infections).  If told by your health care provider, take low-dose aspirin daily starting at age 57. What's next?  Visit your health care provider once a year for a well check visit.  Ask your health care provider how often you should have your eyes and teeth checked.  Stay up to date on all vaccines. This information is not intended to replace advice given to you by your health care provider. Make sure you discuss any questions you have with your health care provider. Document Revised: 02/05/2018 Document Reviewed: 02/05/2018 Elsevier Patient Education  2020 [employer].

## 2019-08-27 ENCOUNTER — Encounter: Payer: Self-pay | Admitting: Family Medicine

## 2019-09-02 ENCOUNTER — Other Ambulatory Visit: Payer: Self-pay | Admitting: Family Medicine

## 2019-09-02 DIAGNOSIS — Z720 Tobacco use: Secondary | ICD-10-CM

## 2019-09-15 ENCOUNTER — Ambulatory Visit: Payer: Federal, State, Local not specified - PPO | Admitting: Dermatology

## 2019-09-15 ENCOUNTER — Other Ambulatory Visit: Payer: Self-pay

## 2019-09-15 DIAGNOSIS — L720 Epidermal cyst: Secondary | ICD-10-CM | POA: Diagnosis not present

## 2019-09-15 NOTE — Progress Notes (Signed)
   Follow-Up Visit   Subjective  Beverly Cook is a 54 y.o. female who presents for the following: Cyst (5 months f/u cyst L inframammary, pt took doxcycline tablet in the past for this cyst ). The cyst is now improved, but persistent and symptomatic.  The pt desires removal.  The following portions of the chart were reviewed this encounter and updated as appropriate:     Review of Systems: No other skin or systemic complaints.  Objective  Well appearing patient in no apparent distress; mood and affect are within normal limits.  A focused examination was performed including chest. Relevant physical exam findings are noted in the Assessment and Plan.  Objective  Left inframammary: 0.7cm Subcutaneous nodule.   Assessment & Plan  Epidermal cyst persistently inflamed and symptomatic Left inframammary  Discussed option of surgical removal or observe  Pt opts for surgery. Will schedule.  Return for surgery .   IMarye Round, CMA, am acting as scribe for Sarina Ser, MD .

## 2019-09-15 NOTE — Patient Instructions (Signed)
Pre-Operative Instructions  You are scheduled for a surgical procedure at [facility]. We recommend you read the following instructions. If you have any questions or concerns, please call the office at [phone].  1. Shower and wash the entire body with soap and water the day of your surgery paying special attention to cleansing at and around the planned surgery site.  2. Avoid aspirin or aspirin containing products at least fourteen (14) days prior to your surgical procedure and for at least one week (7 Days) after your surgical procedure. If you take aspirin on a regular basis for heart disease or history of stroke or for any other reason, we may recommend you continue taking aspirin but please notify us if you take this on a regular basis. Aspirin can cause more bleeding to occur during surgery as well as prolonged bleeding and bruising after surgery.   3. Avoid other nonsteroidal pain medications at least one week prior to surgery and at least one week prior to your surgery. These include medications such as Ibuprofen (Motrin, Advil and Nuprin), Naprosyn, Voltaren, Relafen, etc. If medications are used for therapeutic reasons, please inform us as they can cause increased bleeding or prolonged bleeding during and bruising after surgical procedures.   4. Please advice us if you are taking any "blood thinner" medications such as Coumadin or Dipyridamole or Plavix or similar medications. These cause increased bleeding and prolonged bleeding during and bruising after surgical procedures. We may have to consider discontinuing these medications briefly prior to and shortly after your surgery, if safe to do so.   5. Please inform us of all medications you are currently taking. All medications that are taken regularly should be taken the day of surgery as you always do. Nevertheless, we need to be informed of what medications you are taking prior to surgery to whether they will affect the  procedure or cause any complications.   6. Please inform us of any medication allergies. Also inform us of whether you have allergies to Latex or rubber products or whether you have had any adverse reaction to Lidocaine or Epinephrine.  7. Please inform us of any prosthetic or artificial body parts such as artificial heart valve, joint replacements, etc., or similar condition that might require preoperative antibiotics.   8. We recommend avoidance of alcohol at least two weeks prior to surgery and continued avoidence for at least two weeks after surgery.   9. We recommend discontinuation of tobacco smoking at least two weeks prior to surgery and continued abstinence for at least two weeks after surgery.  10. Do not plan strenuous exercise, strenuous work or strenuous lifting for approximately four weeks after your surgery.   11. We request if you are unable to make your scheduled surgical appointment, please call us at least a week in advance or as soon as you are aware of a problem sot aht we can cancel or reschedule you.   12. You MAKE TAKE TYLENOL (acetaminophen) for pain as it is not a blood thinner.   13. PLEASE PLAN TO BE IN TOWN FOR TWO WEEKS FOLLOWING SURGERY, THIS IS IMPORTANT SO YOU CAN BE CHECKED FOR DRESSING CHANGES, SUTURE REMOVAL AND TO MONITOR FOR POSSIBLE COMPLICATIONS.   

## 2019-09-16 ENCOUNTER — Encounter: Payer: Self-pay | Admitting: Dermatology

## 2019-09-29 ENCOUNTER — Other Ambulatory Visit: Payer: Self-pay | Admitting: Family Medicine

## 2019-09-29 DIAGNOSIS — M47816 Spondylosis without myelopathy or radiculopathy, lumbar region: Secondary | ICD-10-CM

## 2019-09-30 NOTE — Telephone Encounter (Signed)
Last filled on 08/17/2018 at that [facility]. LOV 08/26/19 for CPE.  Notes in regards to this medication from [facility] 08/17/2018 stated the following:  Musculoskeletal and Integument   Spondylosis of lumbar region without myelopathy or radiculopathy    Stable. Occasional Meloxicam. Flexeril provided for prn use with flare up      Relevant Medications   cyclobenzaprine (FLEXERIL) 5 MG tablet

## 2019-10-11 ENCOUNTER — Ambulatory Visit
Admission: RE | Admit: 2019-10-11 | Discharge: 2019-10-11 | Disposition: A | Discharge status: Home / Self Care | Payer: Federal, State, Local not specified - PPO | Source: Ambulatory Visit | Attending: Family Medicine | Admitting: Family Medicine

## 2019-10-11 DIAGNOSIS — Z1231 Encounter for screening mammogram for malignant neoplasm of breast: Secondary | ICD-10-CM | POA: Insufficient documentation

## 2019-10-11 DIAGNOSIS — Z01419 Encounter for gynecological examination (general) (routine) without abnormal findings: Secondary | ICD-10-CM

## 2019-11-02 ENCOUNTER — Encounter: Payer: Self-pay | Admitting: Family Medicine

## 2019-11-02 ENCOUNTER — Ambulatory Visit: Payer: Federal, State, Local not specified - PPO | Admitting: Dermatology

## 2019-11-02 ENCOUNTER — Other Ambulatory Visit: Payer: Self-pay

## 2019-11-02 DIAGNOSIS — W57XXXA Bitten or stung by nonvenomous insect and other nonvenomous arthropods, initial encounter: Secondary | ICD-10-CM

## 2019-11-02 DIAGNOSIS — G629 Polyneuropathy, unspecified: Secondary | ICD-10-CM

## 2019-11-02 DIAGNOSIS — L720 Epidermal cyst: Secondary | ICD-10-CM | POA: Diagnosis not present

## 2019-11-02 MED ORDER — MUPIROCIN 2 % EX OINT: 1.0000 "application " | TOPICAL_OINTMENT | Freq: Every day | CUTANEOUS | 0 refills | Status: DC

## 2019-11-02 NOTE — Patient Instructions (Signed)

## 2019-11-02 NOTE — Progress Notes (Signed)
   Follow-Up Visit   Subjective  Beverly Cook is a 54 y.o. female who presents for the following: Cyst (L inframammary, pt presents for excision today) and bug bite (intermammary, ~4wks, neosporin qd).    The following portions of the chart were reviewed this encounter and updated as appropriate:  Tobacco  Allergies  Meds  Problems  Med Hx  Surg Hx  Fam Hx      Review of Systems:  No other skin or systemic complaints except as noted in HPI or Assessment and Plan.  Objective  Well appearing patient in no apparent distress; mood and affect are within normal limits.  A focused examination was performed including chest. Relevant physical exam findings are noted in the Assessment and Plan.  Objective  intermammary: Pink pap  Objective  Left inframammary: Cystic pap   Assessment & Plan  Bug bite without infection, initial encounter intermammary  Start mupirocin oint qd until healed  Epidermal cyst Left inframammary  Skin excision - Left inframammary  Lesion length (cm):  1.5 Lesion width (cm):  0.7 Margin per side (cm):  0.2 Total excision diameter (cm):  1.9 Informed consent: discussed and consent obtained   Timeout: patient name, date of birth, surgical site, and procedure verified   Procedure prep:  Patient was prepped and draped in usual sterile fashion Prep type:  Isopropyl alcohol and povidone-iodine Anesthesia: the lesion was anesthetized in a standard fashion   Anesthetic:  1% lidocaine w/ epinephrine 1-100,000 buffered w/ 8.4% NaHCO3 (6cc) Instrument used comment:  15c blade Hemostasis achieved with: pressure   Hemostasis achieved with comment:  Electrocautery Outcome: patient tolerated procedure well with no complications   Post-procedure details: sterile dressing applied and wound care instructions given   Dressing type: bandage and pressure dressing (Mupirocin)    Skin repair - Left inframammary Complexity:  Complex Final length (cm):   2.7 Reason for type of repair: reduce tension to allow closure, reduce the risk of dehiscence, infection, and necrosis, reduce subcutaneous dead space and avoid a hematoma, allow closure of the large defect, preserve normal anatomy, preserve normal anatomical and functional relationships and enhance both functionality and cosmetic results   Undermining: area extensively undermined   Undermining comment:  Undermining Defect 1.2cm Subcutaneous layers (deep stitches):  Suture size:  3-0 Suture type: Vicryl (polyglactin 910)   Subcutaneous suture technique: Inverted Dermal. Fine/surface layer approximation (top stitches):  Suture size:  4-0 Stitches: simple running   Stitches comment:  Nylon Suture removal (days):  7 Hemostasis achieved with: suture and pressure Outcome: patient tolerated procedure well with no complications   Post-procedure details: sterile dressing applied and wound care instructions given   Dressing type: bandage and pressure dressing (Mupirocin)    Specimen 1 - Surgical pathology Differential Diagnosis: D48.5 Cyst vs other Check Margins: No Cystic pap 1.5 x 0.7cm  Return in about 1 week (around 11/09/2019) for sr.  I, Othelia Pulling, RMA, am acting as scribe for Sarina Ser, MD . Documentation: I have reviewed the above documentation for accuracy and completeness, and I agree with the above.  Sarina Ser, MD

## 2019-11-03 ENCOUNTER — Encounter: Payer: Self-pay | Admitting: Dermatology

## 2019-11-09 ENCOUNTER — Ambulatory Visit (INDEPENDENT_AMBULATORY_CARE_PROVIDER_SITE_OTHER): Payer: Federal, State, Local not specified - PPO

## 2019-11-09 ENCOUNTER — Other Ambulatory Visit: Payer: Self-pay

## 2019-11-09 DIAGNOSIS — Z4802 Encounter for removal of sutures: Secondary | ICD-10-CM

## 2019-11-09 DIAGNOSIS — L720 Epidermal cyst: Secondary | ICD-10-CM

## 2019-11-09 NOTE — Progress Notes (Addendum)
   Follow-Up Visit   Subjective  Beverly Cook is a 54 y.o. female who presents for the following: post op (patient is here today for suture removal of pathology proven cyst of the L inframammary).  The following portions of the chart were reviewed this encounter and updated as appropriate:     Review of Systems:  No other skin or systemic complaints except as noted in HPI or Assessment and Plan.  Objective  Well appearing patient in no apparent distress; mood and affect are within normal limits.  A focused examination was performed including the trunk. Relevant physical exam findings are noted in the Assessment and Plan.  Objective  left inframmary: Healing excision site   Assessment & Plan  Epidermal inclusion cyst left inframmary  Encounter for Removal of Sutures - Incision site at the L inframammary is clean, dry and intact - Wound cleansed, sutures removed, wound cleansed and steri strips applied.  - Discussed pathology results showing benign cyst - Patient advised to keep steri-strips dry until they fall off. - Scars remodel for a full year. - Once steri-strips fall off, patient can apply over-the-counter silicone scar cream each night to help with scar remodeling if desired. - Patient advised to call with any concerns or if they notice any new or changing lesions.   Return if symptoms worsen or fail to improve.   Luther Redo, CMA, am acting as scribe for Rosaria Ferries, CMA .  Documentation: I have reviewed the above documentation for accuracy and completeness, and I agree with the above.  Brendolyn Patty MD

## 2019-11-10 ENCOUNTER — Telehealth: Payer: Self-pay

## 2019-11-10 NOTE — Telephone Encounter (Signed)
-----   Message from Ralene Bathe, MD sent at 11/05/2019 10:25 AM EDT ----- Skin (M), left inframammary EPIDERMOID CYST, SCARRED, MARGINS FREE  Benign cyst

## 2019-11-10 NOTE — Telephone Encounter (Signed)
Discussed biopsy results with pt  °

## 2019-11-11 ENCOUNTER — Other Ambulatory Visit
Admission: RE | Admit: 2019-11-11 | Discharge: 2019-11-11 | Disposition: A | Discharge status: Home / Self Care | Payer: Federal, State, Local not specified - PPO | Source: Ambulatory Visit | Attending: Family Medicine | Admitting: Family Medicine

## 2019-11-11 DIAGNOSIS — G629 Polyneuropathy, unspecified: Secondary | ICD-10-CM | POA: Diagnosis not present

## 2019-11-11 LAB — VITAMIN B12: Vitamin B-12: 266 pg/mL (ref 180–914)

## 2019-11-11 NOTE — Addendum Note (Signed)
Addended by: Santiago Bur on: 11/11/2019 03:34 PM   Modules accepted: Orders

## 2019-11-16 NOTE — Addendum Note (Signed)
Addended by: Brendolyn Patty on: 11/16/2019 06:13 PM   Modules accepted: Level of Service

## 2020-05-29 ENCOUNTER — Encounter: Payer: Self-pay | Admitting: Radiology

## 2020-09-18 ENCOUNTER — Ambulatory Visit (INDEPENDENT_AMBULATORY_CARE_PROVIDER_SITE_OTHER): Payer: Federal, State, Local not specified - PPO | Admitting: Family Medicine

## 2020-09-18 ENCOUNTER — Encounter: Payer: Self-pay | Admitting: Family Medicine

## 2020-09-18 ENCOUNTER — Other Ambulatory Visit: Payer: Self-pay

## 2020-09-18 VITALS — BP 113/73 | HR 80 | Ht 65.0 in | Wt 128.0 lb

## 2020-09-18 DIAGNOSIS — Z87891 Personal history of nicotine dependence: Secondary | ICD-10-CM | POA: Diagnosis not present

## 2020-09-18 DIAGNOSIS — E8940 Asymptomatic postprocedural ovarian failure: Secondary | ICD-10-CM | POA: Diagnosis not present

## 2020-09-18 DIAGNOSIS — M47816 Spondylosis without myelopathy or radiculopathy, lumbar region: Secondary | ICD-10-CM | POA: Diagnosis not present

## 2020-09-18 DIAGNOSIS — Z01419 Encounter for gynecological examination (general) (routine) without abnormal findings: Secondary | ICD-10-CM

## 2020-09-18 NOTE — Patient Instructions (Addendum)
Intrarosa for vaginal dryness--DHEA  Preventive Care 1-57 Years Old, Female Preventive care refers to lifestyle choices and visits with your health care provider that can promote health and wellness. This includes:  A yearly physical exam. This is also called an annual wellness visit.  Regular dental and eye exams.  Immunizations.  Screening for certain conditions.  Healthy lifestyle choices, such as: ? Eating a healthy diet. ? Getting regular exercise. ? Not using drugs or products that contain nicotine and tobacco. ? Limiting alcohol use. What can I expect for my preventive care visit? Physical exam Your health care provider will check your:  Height and weight. These may be used to calculate your BMI (body mass index). BMI is a measurement that tells if you are at a healthy weight.  Heart rate and blood pressure.  Body temperature.  Skin for abnormal spots. Counseling Your health care provider may ask you questions about your:  Past medical problems.  Family's medical history.  Alcohol, tobacco, and drug use.  Emotional well-being.  Home life and relationship well-being.  Sexual activity.  Diet, exercise, and sleep habits.  Work and work Statistician.  Access to firearms.  Method of birth control.  Menstrual cycle.  Pregnancy history. What immunizations do I need? Vaccines are usually given at various ages, according to a schedule. Your health care provider will recommend vaccines for you based on your age, medical history, and lifestyle or other factors, such as travel or where you work.   What tests do I need? Blood tests  Lipid and cholesterol levels. These may be checked every 5 years, or more often if you are over 86 years old.  Hepatitis C test.  Hepatitis B test. Screening  Lung cancer screening. You may have this screening every year starting at age 12 if you have a 30-pack-year history of smoking and currently smoke or have quit within the  past 15 years.  Colorectal cancer screening. ? All adults should have this screening starting at age 63 and continuing until age 59. ? Your health care provider may recommend screening at age 73 if you are at increased risk. ? You will have tests every 1-10 years, depending on your results and the type of screening test.  Diabetes screening. ? This is done by checking your blood sugar (glucose) after you have not eaten for a while (fasting). ? You may have this done every 1-3 years.  Mammogram. ? This may be done every 1-2 years. ? Talk with your health care provider about when you should start having regular mammograms. This may depend on whether you have a family history of breast cancer.  BRCA-related cancer screening. This may be done if you have a family history of breast, ovarian, tubal, or peritoneal cancers.  Pelvic exam and Pap test. ? This may be done every 3 years starting at age 21. ? Starting at age 58, this may be done every 5 years if you have a Pap test in combination with an HPV test. Other tests  STD (sexually transmitted disease) testing, if you are at risk.  Bone density scan. This is done to screen for osteoporosis. You may have this scan if you are at high risk for osteoporosis. Talk with your health care provider about your test results, treatment options, and if necessary, the need for more tests. Follow these instructions at home: Eating and drinking  Eat a diet that includes fresh fruits and vegetables, whole grains, lean protein, and low-fat dairy products.  Take vitamin and mineral supplements as recommended by your health care provider.  Do not drink alcohol if: ? Your health care provider tells you not to drink. ? You are pregnant, may be pregnant, or are planning to become pregnant.  If you drink alcohol: ? Limit how much you have to 0-1 drink a day. ? Be aware of how much alcohol is in your drink. In the [place] one drink equals one 12 oz bottle of  beer (355 mL), one 5 oz glass of wine (148 mL), or one 1 oz glass of hard liquor (44 mL).   Lifestyle  Take daily care of your teeth and gums. Brush your teeth every morning and night with fluoride toothpaste. Floss one time each day.  Stay active. Exercise for at least 30 minutes 5 or more days each week.  Do not use any products that contain nicotine or tobacco, such as cigarettes, e-cigarettes, and chewing tobacco. If you need help quitting, ask your health care provider.  Do not use drugs.  If you are sexually active, practice safe sex. Use a condom or other form of protection to prevent STIs (sexually transmitted infections).  If you do not wish to become pregnant, use a form of birth control. If you plan to become pregnant, see your health care provider for a prepregnancy visit.  If told by your health care provider, take low-dose aspirin daily starting at age 13.  Find healthy ways to cope with stress, such as: ? Meditation, yoga, or listening to music. ? Journaling. ? Talking to a trusted person. ? Spending time with friends and family. Safety  Always wear your seat belt while driving or riding in a vehicle.  Do not drive: ? If you have been drinking alcohol. Do not ride with someone who has been drinking. ? When you are tired or distracted. ? While texting.  Wear a helmet and other protective equipment during sports activities.  If you have firearms in your house, make sure you follow all gun safety procedures. What's next?  Visit your health care provider once a year for an annual wellness visit.  Ask your health care provider how often you should have your eyes and teeth checked.  Stay up to date on all vaccines. This information is not intended to replace advice given to you by your health care provider. Make sure you discuss any questions you have with your health care provider. Document Revised: 02/29/2020 Document Reviewed: 02/05/2018 Elsevier Patient  Education  2021 [employer].

## 2020-09-18 NOTE — Progress Notes (Signed)
  Subjective:     Beverly Cook is a 55 y.o. female and is here for a comprehensive physical exam. The patient reports problems - vaginal irritation and painful intercourse. Due to dryness.  Quit smoking last year.   The following portions of the patient's history were reviewed and updated as appropriate: allergies, current medications, past family history, past medical history, past social history, past surgical history and problem list.  Review of Systems Pertinent items noted in HPI and remainder of comprehensive ROS otherwise negative.   Objective:    BP 113/73   Pulse 80   Ht 5\' 5"  (1.651 m)   Wt 128 lb (58.1 kg)   BMI 21.30 kg/m  General appearance: alert, cooperative and appears stated age Head: Normocephalic, without obvious abnormality, atraumatic Neck: no adenopathy, supple, symmetrical, trachea midline and thyroid not enlarged, symmetric, no tenderness/mass/nodules Lungs: clear to auscultation bilaterally Breasts: normal appearance, no masses or tenderness, bilateral implants Heart: regular rate and rhythm, S1, S2 normal, no murmur, click, rub or gallop Abdomen: soft, non-tender; bowel sounds normal; no masses,  no organomegaly Pelvic: external genitalia normal, no adnexal masses or tenderness, uterus surgically absent and vaginal atrophy Extremities: Homans sign is negative, no sign of DVT Pulses: 2+ and symmetric Skin: Skin color, texture, turgor normal. No rashes or lesions Lymph nodes: Cervical, supraclavicular, and axillary nodes normal. Neurologic: Grossly normal    Assessment:    Healthy female exam.      Plan:   Problem List Items Addressed This Visit      Unprioritized   Former smoker    Medical sales representative on quitting smoking!!      Surgical menopause - Primary    Has tried estrogen and cream and discussed intrarosa, she will consider.      Spondylosis of lumbar region without myelopathy or radiculopathy    Using turmeric with some success.        Other Visit Diagnoses    Encounter for gynecological examination without abnormal finding       Relevant Orders   MM 3D SCREEN BREAST W/IMPLANT BILATERAL     Return in 1 year (on 09/18/2021).    See After Visit Summary for Counseling Recommendations

## 2020-09-18 NOTE — Assessment & Plan Note (Signed)
Congratulations on quitting smoking!!

## 2020-09-18 NOTE — Assessment & Plan Note (Signed)
Using turmeric with some success.

## 2020-09-18 NOTE — Assessment & Plan Note (Signed)
Has tried estrogen and cream and discussed intrarosa, she will consider.

## 2020-09-18 NOTE — Progress Notes (Signed)
Having issues with vaginal dryness

## 2020-09-19 DIAGNOSIS — E8940 Asymptomatic postprocedural ovarian failure: Secondary | ICD-10-CM

## 2020-09-19 MED ORDER — INTRAROSA 6.5 MG VA INST: 1.0000 [IU] | VAGINAL_INSERT | Freq: Every day | VAGINAL | 11 refills | Status: DC

## 2020-09-20 ENCOUNTER — Ambulatory Visit (INDEPENDENT_AMBULATORY_CARE_PROVIDER_SITE_OTHER): Payer: Federal, State, Local not specified - PPO | Admitting: Family Medicine

## 2020-09-20 ENCOUNTER — Other Ambulatory Visit: Payer: Self-pay

## 2020-09-20 VITALS — BP 90/60 | HR 66 | Temp 97.6°F | Ht 64.5 in | Wt 126.5 lb

## 2020-09-20 DIAGNOSIS — E782 Mixed hyperlipidemia: Secondary | ICD-10-CM | POA: Diagnosis not present

## 2020-09-20 DIAGNOSIS — N951 Menopausal and female climacteric states: Secondary | ICD-10-CM

## 2020-09-20 DIAGNOSIS — R208 Other disturbances of skin sensation: Secondary | ICD-10-CM | POA: Diagnosis not present

## 2020-09-20 DIAGNOSIS — R12 Heartburn: Secondary | ICD-10-CM | POA: Insufficient documentation

## 2020-09-20 DIAGNOSIS — G4701 Insomnia due to medical condition: Secondary | ICD-10-CM | POA: Diagnosis not present

## 2020-09-20 LAB — LIPID PANEL
Cholesterol: 218 mg/dL — ABNORMAL HIGH (ref 0–200)
HDL: 72.2 mg/dL (ref >39.00)
LDL Cholesterol: 127 mg/dL — ABNORMAL HIGH (ref 0–99)
NonHDL: 146.09
Total CHOL/HDL Ratio: 3
Triglycerides: 95 mg/dL (ref 0.0–149.0)
VLDL: 19 mg/dL (ref 0.0–40.0)

## 2020-09-20 LAB — COMPREHENSIVE METABOLIC PANEL
ALT: 26 U/L (ref 0–35)
AST: 18 U/L (ref 0–37)
Albumin: 4.4 g/dL (ref 3.5–5.2)
Alkaline Phosphatase: 66 U/L (ref 39–117)
BUN: 12 mg/dL (ref 6–23)
CO2: 34 mEq/L — ABNORMAL HIGH (ref 19–32)
Calcium: 9.3 mg/dL (ref 8.4–10.5)
Chloride: 102 mEq/L (ref 96–112)
Creatinine, Ser: 0.72 mg/dL (ref 0.40–1.20)
GFR: 93.05 mL/min (ref >60.00)
Glucose, Bld: 84 mg/dL (ref 70–99)
Potassium: 4.2 mEq/L (ref 3.5–5.1)
Sodium: 142 mEq/L (ref 135–145)
Total Bilirubin: 1.2 mg/dL (ref 0.2–1.2)
Total Protein: 6.9 g/dL (ref 6.0–8.3)

## 2020-09-20 LAB — TSH: TSH: 0.78 u[IU]/mL (ref 0.35–4.50)

## 2020-09-20 MED ORDER — GABAPENTIN 300 MG PO CAPS: 300.0000 mg | ORAL_CAPSULE | Freq: Three times a day (TID) | ORAL | 1 refills | Status: DC

## 2020-09-20 NOTE — Assessment & Plan Note (Signed)
Pt notes nocturnal hotflashes which are waking her from sleep. Suspect this is the root cause of insomnia. Discussed option of SSRI vs gabapentin (family hx of breast cancer so want to avoid estrogen). Giving burning feet, elected to start with gabapentin. She does not want to take TID so will try with bedtime dosing and increase as tolerated to effect up to 900 mg. If not improvement will try SSRI.

## 2020-09-20 NOTE — Assessment & Plan Note (Signed)
Focus on reducing hot flashes as trigger for nighttime wakening and reassess if no improvement.

## 2020-09-20 NOTE — Assessment & Plan Note (Signed)
Reassuring exam. Recent normal B12 and normal sensation/strength. Trial of gabapentin and check CMP and tsh today.

## 2020-09-20 NOTE — Progress Notes (Signed)
Subjective:     Beverly Cook is a 55 y.o. female presenting for Annual Exam (Concerns of burning feet, insomnia and heart burn )     HPI   #Burning feet - years - has seen podiatry - injected heels with steroids, had orthotics due to high arch - does get better - but keeps coming back - burning along the entire bottom of the foot - cannot have anything touching the feet at night - symptoms come and go but do not change whether she is wearing the orthotics - gets tingling and numbness in toes occasionally when doing elliptical  - no skin changes - will put ice under a towel with improvement in symptoms - no tingling in the legs - no pain in the feet - no back pain  #Heartburn - more frequent as she is getting older - no weight gain - some food triggers - previously not an issue - will take [place] 3 times a week with improvement - does not lay down after meals - 16-20 oz of coffee - drinks alcohol on the weekend  #Insomnia - years of symptoms - falls asleep easily - will wake up after 3-4 hours and takes 2 hours to get back to sleep - treatment: benadryl will help her fall asleep but still waking up at night, melatonin - will fall asleep but still wake up - feels tired during the day like she needs a nap but does not nap - will get a hot flash and this will trigger her waking up  -    Review of Systems   Social History   Tobacco Use  Smoking Status Former Smoker  . Packs/day: 0.25  . Years: 30.00  . Pack years: 7.50  . Types: Cigarettes  . Quit date: 09/19/2019  . Years since quitting: 1.0  Smokeless Tobacco Never Used        Objective:    BP Readings from Last 3 Encounters:  09/20/20 90/60  09/18/20 113/73  08/26/19 (!) 94/56   Wt Readings from Last 3 Encounters:  09/20/20 126 lb 8 oz (57.4 kg)  09/18/20 128 lb (58.1 kg)  08/26/19 125 lb 12 oz (57 kg)    BP 90/60   Pulse 66   Temp 97.6 F (36.4 C) (Temporal)   Ht 5' 4.5" (1.638 m)    Wt 126 lb 8 oz (57.4 kg)   SpO2 97%   BMI 21.38 kg/m    Physical Exam Constitutional:      General: She is not in acute distress.    Appearance: She is well-developed. She is not diaphoretic.  HENT:     Right Ear: External ear normal.     Left Ear: External ear normal.     Nose: Nose normal.  Eyes:     Conjunctiva/sclera: Conjunctivae normal.  Cardiovascular:     Rate and Rhythm: Normal rate.  Pulmonary:     Effort: Pulmonary effort is normal.  Musculoskeletal:     Cervical back: Neck supple.     Comments: Foot:  No swelling or erythema No ttp Normal rom Normal monofilament sensation  Skin:    General: Skin is warm and dry.     Capillary Refill: Capillary refill takes less than 2 seconds.  Neurological:     Mental Status: She is alert. Mental status is at baseline.  Psychiatric:        Mood and Affect: Mood normal.        Behavior: Behavior normal.  Assessment & Plan:   Problem List Items Addressed This Visit      Cardiovascular and Mediastinum   Hot flashes due to menopause    Pt notes nocturnal hotflashes which are waking her from sleep. Suspect this is the root cause of insomnia. Discussed option of SSRI vs gabapentin (family hx of breast cancer so want to avoid estrogen). Giving burning feet, elected to start with gabapentin. She does not want to take TID so will try with bedtime dosing and increase as tolerated to effect up to 900 mg. If not improvement will try SSRI.       Relevant Medications   gabapentin (NEURONTIN) 300 MG capsule   Other Relevant Orders   Comprehensive metabolic panel     Other   HLD (hyperlipidemia)   Relevant Orders   Comprehensive metabolic panel   Lipid panel   Heart burn    Advised trial of ppi or h2 blocker.       Insomnia due to medical condition    Focus on reducing hot flashes as trigger for nighttime wakening and reassess if no improvement.       Burning sensation of feet - Primary    Reassuring exam.  Recent normal B12 and normal sensation/strength. Trial of gabapentin and check CMP and tsh today.       Relevant Medications   gabapentin (NEURONTIN) 300 MG capsule   Other Relevant Orders   Comprehensive metabolic panel   TSH       Return in about 4 weeks (around 10/18/2020) for annual and follow-up.  Lesleigh Noe, MD  This visit occurred during the SARS-CoV-2 public health emergency.  Safety protocols were in place, including screening questions prior to the visit, additional usage of staff PPE, and extensive cleaning of exam room while observing appropriate contact time as indicated for disinfecting solutions.

## 2020-09-20 NOTE — Patient Instructions (Addendum)
#  Gabapentin - start 300 mg at night - if after 1-2 weeks tolerating with no improvement can increase to 600 mg at night  Return if not working Call or mychart if working well and I can refill the dose that you are taking    Try omeprazole or famotidine daily for 2-6 weeks for heartburn

## 2020-09-20 NOTE — Assessment & Plan Note (Signed)
Advised trial of ppi or h2 blocker.

## 2020-09-21 ENCOUNTER — Encounter: Payer: Self-pay | Admitting: Family Medicine

## 2020-09-21 MED ORDER — GABAPENTIN 100 MG PO CAPS: 100.0000 mg | ORAL_CAPSULE | Freq: Every day | ORAL | 0 refills | Status: DC

## 2020-10-11 ENCOUNTER — Inpatient Hospital Stay: Admission: RE | Admit: 2020-10-11 | Payer: Federal, State, Local not specified - PPO | Source: Ambulatory Visit

## 2020-10-17 ENCOUNTER — Ambulatory Visit
Admission: RE | Admit: 2020-10-17 | Discharge: 2020-10-17 | Disposition: A | Discharge status: Home / Self Care | Payer: Federal, State, Local not specified - PPO | Source: Ambulatory Visit | Attending: Family Medicine | Admitting: Family Medicine

## 2020-10-17 ENCOUNTER — Other Ambulatory Visit: Payer: Self-pay

## 2020-10-17 DIAGNOSIS — Z01419 Encounter for gynecological examination (general) (routine) without abnormal findings: Secondary | ICD-10-CM | POA: Diagnosis not present

## 2020-10-17 DIAGNOSIS — Z1231 Encounter for screening mammogram for malignant neoplasm of breast: Secondary | ICD-10-CM | POA: Insufficient documentation

## 2020-11-08 ENCOUNTER — Encounter: Payer: Self-pay | Admitting: Family Medicine

## 2020-11-15 ENCOUNTER — Ambulatory Visit: Payer: Federal, State, Local not specified - PPO | Admitting: Family Medicine

## 2020-11-15 ENCOUNTER — Other Ambulatory Visit: Payer: Self-pay

## 2020-11-15 VITALS — BP 100/60 | HR 71 | Temp 97.3°F | Ht 64.5 in | Wt 128.0 lb

## 2020-11-15 DIAGNOSIS — R208 Other disturbances of skin sensation: Secondary | ICD-10-CM

## 2020-11-15 MED ORDER — AMITRIPTYLINE HCL 10 MG PO TABS: ORAL_TABLET | ORAL | 1 refills | Status: DC

## 2020-11-15 NOTE — Assessment & Plan Note (Signed)
Work-up thus far has been normal. Gabapentin 300 mg at night w/o improvement and over sedatiting. Discussed lyrica including side effects and after discussion pt decided to try Amitriptyline again as this worked but resulted in weight gain. Will start with 10 mg and increase to 25 mg (previous) dose if ineffective. She does have neurology follow-up in 2 months. Lyrica information provided to consider if amitriptyline ineffective/side effects.

## 2020-11-15 NOTE — Patient Instructions (Addendum)
# Burning feet - try Elavil - increase to 20 mg as needed - reach out for refill or dose change  Pregabalin capsules What is this medicine? PREGABALIN (pre GAB a lin) is used to treat nerve pain from diabetes, shingles, spinal cord injury, and fibromyalgia. It is also used to control seizures in epilepsy. This medicine may be used for other purposes; ask your health care provider or pharmacist if you have questions. COMMON BRAND NAME(S): Lyrica What should I tell my health care provider before I take this medicine? They need to know if you have any of these conditions:  drug abuse or addiction  heart failure  kidney disease  lung disease  suicidal thoughts, plans or attempt  an unusual or allergic reaction to pregabalin, other medicines, foods, dyes, or preservatives  pregnant or trying to get pregnant  breast-feeding How should I use this medicine? Take this medicine by mouth with water. Take it as directed on the prescription label at the same time every day. You can take it with or without food. If it upsets your stomach, take it with food. Keep taking it unless your health care provider tells you to stop. A special MedGuide will be given to you by the pharmacist with each prescription and refill. Be sure to read this information carefully each time. Talk to your health care provider about the use of this medicine in children. While it may be prescribed for children as young as 1 month for selected conditions, precautions do apply. Overdosage: If you think you have taken too much of this medicine contact a poison control center or emergency room at once. NOTE: This medicine is only for you. Do not share this medicine with others. What if I miss a dose? If you miss a dose, take it as soon as you can. If it is almost time for your next dose, take only that dose. Do not take double or extra doses. What may interact with this medicine?  alcohol  antihistamines for allergy,  cough, and cold  certain medicines for anxiety or sleep  certain medicines for blood pressure, heart disease  certain medicines for depression like amitriptyline, fluoxetine, sertraline  certain medicines for diabetes, like pioglitazone, rosiglitazone  certain medicines for seizures like phenobarbital, primidone  general anesthetics like halothane, isoflurane, methoxyflurane, propofol  medicines that relax muscles for surgery  narcotic medicines for pain  phenothiazines like chlorpromazine, mesoridazine, prochlorperazine, thioridazine This list may not describe all possible interactions. Give your health care provider a list of all the medicines, herbs, non-prescription drugs, or dietary supplements you use. Also tell them if you smoke, drink alcohol, or use illegal drugs. Some items may interact with your medicine. What should I watch for while using this medicine? Visit your health care provider for regular checks on your progress. Tell your health care provider if your symptoms do not start to get better or if they get worse. Do not suddenly stop taking this medicine. You may develop a severe reaction. Your health care provider will tell you how much medicine to take. If your health care provider wants you to stop the medicine, the dose may be slowly lowered over time to avoid any side effects. You may get drowsy or dizzy. Do not drive, use machinery, or do anything that needs mental alertness until you know how this medicine affects you. Do not stand up or sit up quickly, especially if you are an older patient. This reduces the risk of dizzy or fainting spells.  Alcohol may interfere with the effect of this medicine. Avoid alcoholic drinks. If you or your family notice any changes in your behavior, such as new or worsening depression, thoughts of harming yourself, anxiety, other unusual or disturbing thoughts, or memory loss, call your health care provider right away. Wear a medical ID  bracelet or chain if you are taking this medicine for seizures. Carry a card that describes your condition. List the medicines and doses you take on the card. This medicine may make it more difficult to father a child. Talk to your health care provider if you are concerned about your fertility. What side effects may I notice from receiving this medicine? Side effects that you should report to your doctor or health care provider as soon as possible:  allergic reactions (skin rash, itching or hives; swelling of the face, lips, or tongue)  changes in vision  edema (sudden weight gain; swelling of the ankles, feet, hands or other unusual swelling; trouble breathing)  muscle injury (dark urine; trouble passing urine or change in the amount of urine; unusually weak or tired; muscle pain; back pain)  suicidal thoughts, mood changes  trouble breathing  unusual bruising or bleeding Side effects that usually do not require medical attention (report these to your doctor or health care provider if they continue or are bothersome):  dizziness  dry mouth  tiredness  weight gain This list may not describe all possible side effects. Call your doctor for medical advice about side effects. You may report side effects to FDA at 1-800-FDA-1088. Where should I keep my medicine? Keep out of the reach of children and pets. This medicine can be abused. Keep it in a safe place to protect it from theft. Do not share it with anyone. It is only for you. Selling or giving away this medicine is dangerous and against the law. Store at [employer] [address] (77 degrees F). Get rid of any unused medicine after the expiration date. This medicine may cause harm and death if it is taken by other adults, children, or pets. It is important to get rid of the medicine as soon as you no longer need it or it is expired. You can do this in two ways:  Take the medicine to a medicine take-back program. Check with your pharmacy or law  enforcement to find a location.  If you cannot return the medicine, check the label or package insert to see if the medicine should be thrown out in the garbage or flushed down the toilet. If you are not sure, ask your health care provider. If it is safe to put it in the trash, take the medicine out of the container. Mix the medicine with cat litter, dirt, coffee grounds, or other unwanted substance. Seal the mixture in a bag or container. Put it in the trash. NOTE: This sheet is a summary. It may not cover all possible information. If you have questions about this medicine, talk to your doctor, pharmacist, or health care provider.  2021 Elsevier/Gold Standard (2019-09-10 22:46:17)

## 2020-11-15 NOTE — Progress Notes (Signed)
   Subjective:     Beverly Cook is a 55 y.o. female presenting for burning of feet (Both x 5 years )     HPI  #Burning feet - gabapentin makes her sleepy - also concerned about just taking higher doses - has an appointment with neurology in July - amitriptyline made her gain weight - so doesn't want to do this again - but I also worked - hesitant to be on high doses of any medication   Review of Systems   Social History   Tobacco Use  Smoking Status Former Smoker  . Packs/day: 0.25  . Years: 30.00  . Pack years: 7.50  . Types: Cigarettes  . Quit date: 09/19/2019  . Years since quitting: 1.1  Smokeless Tobacco Never Used        Objective:    BP Readings from Last 3 Encounters:  11/15/20 100/60  09/20/20 90/60  09/18/20 113/73   Wt Readings from Last 3 Encounters:  11/15/20 128 lb (58.1 kg)  09/20/20 126 lb 8 oz (57.4 kg)  09/18/20 128 lb (58.1 kg)    BP 100/60   Pulse 71   Temp (!) 97.3 F (36.3 C) (Temporal)   Ht 5' 4.5" (1.638 m)   Wt 128 lb (58.1 kg)   SpO2 98%   BMI 21.63 kg/m    Physical Exam Constitutional:      General: She is not in acute distress.    Appearance: She is well-developed. She is not diaphoretic.  HENT:     Right Ear: External ear normal.     Left Ear: External ear normal.  Eyes:     Conjunctiva/sclera: Conjunctivae normal.  Cardiovascular:     Rate and Rhythm: Normal rate.  Pulmonary:     Effort: Pulmonary effort is normal.  Musculoskeletal:     Cervical back: Neck supple.  Skin:    General: Skin is warm and dry.     Capillary Refill: Capillary refill takes less than 2 seconds.  Neurological:     Mental Status: She is alert. Mental status is at baseline.  Psychiatric:        Mood and Affect: Mood normal.        Behavior: Behavior normal.           Assessment & Plan:   Problem List Items Addressed This Visit      Other   Burning sensation of feet - Primary    Work-up thus far has been normal.  Gabapentin 300 mg at night w/o improvement and over sedatiting. Discussed lyrica including side effects and after discussion pt decided to try Amitriptyline again as this worked but resulted in weight gain. Will start with 10 mg and increase to 25 mg (previous) dose if ineffective. She does have neurology follow-up in 2 months. Lyrica information provided to consider if amitriptyline ineffective/side effects.       Relevant Medications   amitriptyline (ELAVIL) 10 MG tablet       Return if symptoms worsen or fail to improve.  Lesleigh Noe, MD  This visit occurred during the SARS-CoV-2 public health emergency.  Safety protocols were in place, including screening questions prior to the visit, additional usage of staff PPE, and extensive cleaning of exam room while observing appropriate contact time as indicated for disinfecting solutions.

## 2020-12-07 ENCOUNTER — Other Ambulatory Visit: Payer: Self-pay | Admitting: Family Medicine

## 2020-12-07 DIAGNOSIS — R208 Other disturbances of skin sensation: Secondary | ICD-10-CM

## 2021-01-04 DIAGNOSIS — Z131 Encounter for screening for diabetes mellitus: Secondary | ICD-10-CM | POA: Diagnosis not present

## 2021-01-04 DIAGNOSIS — M545 Low back pain, unspecified: Secondary | ICD-10-CM | POA: Diagnosis not present

## 2021-01-04 DIAGNOSIS — R208 Other disturbances of skin sensation: Secondary | ICD-10-CM | POA: Diagnosis not present

## 2021-01-04 DIAGNOSIS — R2681 Unsteadiness on feet: Secondary | ICD-10-CM | POA: Diagnosis not present

## 2021-01-04 DIAGNOSIS — Z114 Encounter for screening for human immunodeficiency virus [HIV]: Secondary | ICD-10-CM | POA: Diagnosis not present

## 2021-01-04 DIAGNOSIS — Z79899 Other long term (current) drug therapy: Secondary | ICD-10-CM | POA: Diagnosis not present

## 2021-01-04 DIAGNOSIS — R200 Anesthesia of skin: Secondary | ICD-10-CM | POA: Diagnosis not present

## 2021-01-18 ENCOUNTER — Other Ambulatory Visit: Payer: Self-pay | Admitting: Family Medicine

## 2021-01-18 DIAGNOSIS — R208 Other disturbances of skin sensation: Secondary | ICD-10-CM

## 2021-01-19 NOTE — Telephone Encounter (Signed)
Mychart message sent to pt asking what dose she is taking now.

## 2021-02-14 DIAGNOSIS — R208 Other disturbances of skin sensation: Secondary | ICD-10-CM | POA: Diagnosis not present

## 2021-02-26 DIAGNOSIS — M13842 Other specified arthritis, left hand: Secondary | ICD-10-CM | POA: Diagnosis not present

## 2021-03-29 DIAGNOSIS — G5793 Unspecified mononeuropathy of bilateral lower limbs: Secondary | ICD-10-CM | POA: Diagnosis not present

## 2021-04-04 DIAGNOSIS — M545 Low back pain, unspecified: Secondary | ICD-10-CM | POA: Diagnosis not present

## 2021-04-04 DIAGNOSIS — R208 Other disturbances of skin sensation: Secondary | ICD-10-CM | POA: Diagnosis not present

## 2021-04-04 DIAGNOSIS — G629 Polyneuropathy, unspecified: Secondary | ICD-10-CM | POA: Diagnosis not present

## 2021-04-04 DIAGNOSIS — E569 Vitamin deficiency, unspecified: Secondary | ICD-10-CM | POA: Diagnosis not present

## 2021-04-11 ENCOUNTER — Encounter: Payer: Self-pay | Admitting: Family Medicine

## 2021-04-13 NOTE — Telephone Encounter (Signed)
LMTCB to schedule an appt with Dr. Einar Pheasant on Monday

## 2021-04-16 ENCOUNTER — Other Ambulatory Visit: Payer: Self-pay

## 2021-04-16 ENCOUNTER — Ambulatory Visit: Payer: Federal, State, Local not specified - PPO | Admitting: Family Medicine

## 2021-04-16 VITALS — BP 110/70 | HR 78 | Temp 96.2°F | Ht 64.5 in | Wt 129.5 lb

## 2021-04-16 DIAGNOSIS — R0789 Other chest pain: Secondary | ICD-10-CM | POA: Diagnosis not present

## 2021-04-16 LAB — COMPREHENSIVE METABOLIC PANEL
ALT: 21 U/L (ref 0–35)
AST: 20 U/L (ref 0–37)
Albumin: 4.4 g/dL (ref 3.5–5.2)
Alkaline Phosphatase: 66 U/L (ref 39–117)
BUN: 14 mg/dL (ref 6–23)
CO2: 32 mEq/L (ref 19–32)
Calcium: 9.2 mg/dL (ref 8.4–10.5)
Chloride: 104 mEq/L (ref 96–112)
Creatinine, Ser: 0.7 mg/dL (ref 0.40–1.20)
GFR: 95.87 mL/min (ref >60.00)
Glucose, Bld: 112 mg/dL — ABNORMAL HIGH (ref 70–99)
Potassium: 4 mEq/L (ref 3.5–5.1)
Sodium: 142 mEq/L (ref 135–145)
Total Bilirubin: 1.1 mg/dL (ref 0.2–1.2)
Total Protein: 6.6 g/dL (ref 6.0–8.3)

## 2021-04-16 LAB — LIPASE: Lipase: 39 U/L (ref 11.0–59.0)

## 2021-04-16 NOTE — Patient Instructions (Signed)
Chest pain - Labs - Ultrasound  If normal - can plan for cardiology referral but consider trial of Omeprazole 20 mg once daily (prilosec)

## 2021-04-16 NOTE — Assessment & Plan Note (Signed)
Etiology unclear - substernal concerning for cardiac but reports normal EKG at work clinic and non-exertional. Ddx: Reflux, GB, esophageal spasm, msk (though no ttp and intermittent). Discussed labs today. Consider trial of PPI. If all blood work normal and [place] w/o stones, given tobacco hx would recommend cardiology evaluation to consider stress testing or additional work-up.

## 2021-04-16 NOTE — Progress Notes (Signed)
Subjective:     Beverly Cook is a 55 y.o. female presenting for Chest Pain (Stabbing pain in center of chest and pain radiated into back. Has happened twice, each lasting 10-15 minutes, at rest. Ate an hour before each episode. Hot flash would happen during episode. No pain down arm or into jaw. )     Chest Pain        Chest Pain  This is a new problem. The problem occurs intermittently. The pain is present in the substernal region. The pain radiates to the mid back. Associated symptoms include diaphoresis (hot flash). Pertinent negatives include no abdominal pain, back pain, cough, dizziness, exertional chest pressure, fever, headaches, irregular heartbeat, nausea, palpitations, shortness of breath, vomiting or weakness. Associated symptoms comments: Heartburn - occasionally treated with pepcid . The pain is aggravated by food (occured at rest). She has tried nothing for the symptoms.    One episode at rest - riding in a car - Oct 7 Another episode at work - was talking to a patient - had "normal" EKG w/in 20 minutes of the episode - Nov 3     Review of Systems  Constitutional:  Positive for diaphoresis (hot flash). Negative for fever.  Respiratory:  Negative for cough and shortness of breath.   Cardiovascular:  Positive for chest pain. Negative for palpitations.  Gastrointestinal:  Negative for abdominal pain, nausea and vomiting.  Musculoskeletal:  Negative for back pain.  Neurological:  Negative for dizziness, weakness and headaches.   Review of Systems  Cardiovascular:  Positive for chest pain.    Social History   Tobacco Use  Smoking Status Former   Packs/day: 0.25   Years: 30.00   Pack years: 7.50   Types: Cigarettes   Quit date: 09/19/2019   Years since quitting: 1.5  Smokeless Tobacco Never        Objective:    BP Readings from Last 3 Encounters:  04/16/21 110/70  11/15/20 100/60  09/20/20 90/60   Wt Readings from Last 3 Encounters:  04/16/21 129  lb 8 oz (58.7 kg)  11/15/20 128 lb (58.1 kg)  09/20/20 126 lb 8 oz (57.4 kg)    BP 110/70   Pulse 78   Temp (!) 96.2 F (35.7 C) (Temporal)   Ht 5' 4.5" (1.638 m)   Wt 129 lb 8 oz (58.7 kg)   SpO2 97%   BMI 21.89 kg/m    Physical Exam Constitutional:      General: She is not in acute distress.    Appearance: She is well-developed. She is not diaphoretic.  HENT:     Right Ear: External ear normal.     Left Ear: External ear normal.  Eyes:     Conjunctiva/sclera: Conjunctivae normal.  Cardiovascular:     Rate and Rhythm: Normal rate and regular rhythm.     Heart sounds: No murmur heard. Pulmonary:     Effort: Pulmonary effort is normal. No respiratory distress.     Breath sounds: Normal breath sounds. No wheezing.  Chest:     Chest wall: No lacerations or tenderness.  Abdominal:     General: Abdomen is flat. Bowel sounds are normal. There is no distension.     Palpations: Abdomen is soft.     Tenderness: There is no abdominal tenderness. There is no guarding or rebound. Negative signs include Murphy's sign.  Musculoskeletal:     Cervical back: Neck supple.  Skin:    General: Skin is warm and dry.  Capillary Refill: Capillary refill takes less than 2 seconds.  Neurological:     Mental Status: She is alert. Mental status is at baseline.  Psychiatric:        Mood and Affect: Mood normal.        Behavior: Behavior normal.          Assessment & Plan:   Problem List Items Addressed This Visit       Other   Other chest pain - Primary    Etiology unclear - substernal concerning for cardiac but reports normal EKG at work clinic and non-exertional. Ddx: Reflux, GB, esophageal spasm, msk (though no ttp and intermittent). Discussed labs today. Consider trial of PPI. If all blood work normal and [place] w/o stones, given tobacco hx would recommend cardiology evaluation to consider stress testing or additional work-up.       Relevant Orders   Comprehensive metabolic  panel   Lipase   US ABDOMEN LIMITED RUQ (LIVER/GB)     Return if symptoms worsen or fail to improve.  Lesleigh Noe, MD  This visit occurred during the SARS-CoV-2 public health emergency.  Safety protocols were in place, including screening questions prior to the visit, additional usage of staff PPE, and extensive cleaning of exam room while observing appropriate contact time as indicated for disinfecting solutions.

## 2021-04-23 ENCOUNTER — Ambulatory Visit
Admission: RE | Admit: 2021-04-23 | Discharge: 2021-04-23 | Disposition: A | Discharge status: Home / Self Care | Payer: Federal, State, Local not specified - PPO | Source: Ambulatory Visit | Attending: Family Medicine | Admitting: Family Medicine

## 2021-04-23 DIAGNOSIS — R0789 Other chest pain: Secondary | ICD-10-CM | POA: Insufficient documentation

## 2021-04-23 DIAGNOSIS — R109 Unspecified abdominal pain: Secondary | ICD-10-CM | POA: Diagnosis not present

## 2021-04-24 ENCOUNTER — Encounter: Payer: Self-pay | Admitting: Family Medicine

## 2021-04-25 NOTE — Telephone Encounter (Signed)
Beverly Cook, patient needs repeat US of liver for monitoring of right liver mass. See [place] from November 2022.

## 2021-05-14 ENCOUNTER — Ambulatory Visit: Payer: Federal, State, Local not specified - PPO | Admitting: Dermatology

## 2021-05-14 ENCOUNTER — Other Ambulatory Visit: Payer: Self-pay

## 2021-05-14 DIAGNOSIS — L578 Other skin changes due to chronic exposure to nonionizing radiation: Secondary | ICD-10-CM | POA: Diagnosis not present

## 2021-05-14 DIAGNOSIS — D1801 Hemangioma of skin and subcutaneous tissue: Secondary | ICD-10-CM

## 2021-05-14 DIAGNOSIS — Z1283 Encounter for screening for malignant neoplasm of skin: Secondary | ICD-10-CM | POA: Diagnosis not present

## 2021-05-14 DIAGNOSIS — L821 Other seborrheic keratosis: Secondary | ICD-10-CM

## 2021-05-14 DIAGNOSIS — L814 Other melanin hyperpigmentation: Secondary | ICD-10-CM | POA: Diagnosis not present

## 2021-05-14 DIAGNOSIS — L570 Actinic keratosis: Secondary | ICD-10-CM | POA: Diagnosis not present

## 2021-05-14 DIAGNOSIS — D239 Other benign neoplasm of skin, unspecified: Secondary | ICD-10-CM

## 2021-05-14 DIAGNOSIS — D229 Melanocytic nevi, unspecified: Secondary | ICD-10-CM

## 2021-05-14 NOTE — Patient Instructions (Addendum)
Use neosporin under right great toe   Actinic keratoses are precancerous spots that appear secondary to cumulative UV radiation exposure/sun exposure over time. They are chronic with expected duration over 1 year. A portion of actinic keratoses will progress to squamous cell carcinoma of the skin. It is not possible to reliably predict which spots will progress to skin cancer and so treatment is recommended to prevent development of skin cancer.  Recommend daily broad spectrum sunscreen SPF 30+ to sun-exposed areas, reapply every 2 hours as needed.  Recommend staying in the shade or wearing long sleeves, sun glasses (UVA+UVB protection) and wide brim hats (4-inch brim around the entire circumference of the hat). Call for new or changing lesions.   Cryotherapy Aftercare  Wash gently with soap and water everyday.   Apply Vaseline and Band-Aid daily until healed.     Seborrheic Keratosis  What causes seborrheic keratoses? Seborrheic keratoses are harmless, common skin growths that first appear during adult life.  As time goes by, more growths appear.  Some people may develop a large number of them.  Seborrheic keratoses appear on both covered and uncovered body parts.  They are not caused by sunlight.  The tendency to develop seborrheic keratoses can be inherited.  They vary in color from skin-colored to gray, brown, or even black.  They can be either smooth or have a rough, warty surface.   Seborrheic keratoses are superficial and look as if they were stuck on the skin.  Under the microscope this type of keratosis looks like layers upon layers of skin.  That is why at times the top layer may seem to fall off, but the rest of the growth remains and re-grows.    Treatment Seborrheic keratoses do not need to be treated, but can easily be removed in the office.  Seborrheic keratoses often cause symptoms when they rub on clothing or jewelry.  Lesions can be in the way of shaving.  If they become  inflamed, they can cause itching, soreness, or burning.  Removal of a seborrheic keratosis can be accomplished by freezing, burning, or surgery. If any spot bleeds, scabs, or grows rapidly, please return to have it checked, as these can be an indication of a skin cancer.   Melanoma ABCDEs  Melanoma is the most dangerous type of skin cancer, and is the leading cause of death from skin disease.  You are more likely to develop melanoma if you: Have light-colored skin, light-colored eyes, or red or blond hair Spend a lot of time in the sun Tan regularly, either outdoors or in a tanning bed Have had blistering sunburns, especially during childhood Have a close family member who has had a melanoma Have atypical moles or large birthmarks  Early detection of melanoma is key since treatment is typically straightforward and cure rates are extremely high if we catch it early.   The first sign of melanoma is often a change in a mole or a new dark spot.  The ABCDE system is a way of remembering the signs of melanoma.  A for asymmetry:  The two halves do not match. B for border:  The edges of the growth are irregular. C for color:  A mixture of colors are present instead of an even brown color. D for diameter:  Melanomas are usually (but not always) greater than 21mm - the size of a pencil eraser. E for evolution:  The spot keeps changing in size, shape, and color.  Please check your skin  once per month between visits. You can use a small mirror in front and a large mirror behind you to keep an eye on the back side or your body.   If you see any new or changing lesions before your next follow-up, please call to schedule a visit.  Please continue daily skin protection including broad spectrum sunscreen SPF 30+ to sun-exposed areas, reapplying every 2 hours as needed when you're outdoors.   Staying in the shade or wearing long sleeves, sun glasses (UVA+UVB protection) and wide brim hats (4-inch brim  around the entire circumference of the hat) are also recommended for sun protection.    If You Need Anything After Your Visit  If you have any questions or concerns for your doctor, please call our main line at [phone] and press option 4 to reach your doctor's medical assistant. If no one answers, please leave a voicemail as directed and we will return your call as soon as possible. Messages left after 4 pm will be answered the following business day.   You may also send [place] a message via [place]. We typically respond to MyChart messages within 1-2 business days.  For prescription refills, please ask your pharmacy to contact our office. Our fax number is [phone].  If you have an urgent issue when the clinic is closed that cannot wait until the next business day, you can page your doctor at the number below.    Please note that while we do our best to be available for urgent issues outside of office hours, we are not available 24/7.   If you have an urgent issue and are unable to reach [place], you may choose to seek medical care at your doctor's office, retail clinic, urgent care center, or emergency room.  If you have a medical emergency, please immediately call 911 or go to the emergency department.  Pager Numbers  - Dr. Nehemiah Massed: [phone]  - Dr. Laurence Ferrari: [phone]  - Dr. Nicole Kindred: [phone]  In the event of inclement weather, please call our main line at [phone] for an update on the status of any delays or closures.  Dermatology Medication Tips: Please keep the boxes that topical medications come in in order to help keep track of the instructions about where and how to use these. Pharmacies typically print the medication instructions only on the boxes and not directly on the medication tubes.   If your medication is too expensive, please contact our office at [phone] option 4 or send [place] a message through [place].   We are unable to tell what your co-pay for  medications will be in advance as this is different depending on your insurance coverage. However, we may be able to find a substitute medication at lower cost or fill out paperwork to get insurance to cover a needed medication.   If a prior authorization is required to get your medication covered by your insurance company, please allow [place] 1-2 business days to complete this process.  Drug prices often vary depending on where the prescription is filled and some pharmacies may offer cheaper prices.  The website www.goodrx.com contains coupons for medications through different pharmacies. The prices here do not account for what the cost may be with help from insurance (it may be cheaper with your insurance), but the website can give you the price if you did not use any insurance.  - You can print the associated coupon and take it with your prescription to the pharmacy.  - You may  also stop by our office during regular business hours and pick up a GoodRx coupon card.  - If you need your prescription sent electronically to a different pharmacy, notify our office through [facility] or by phone at [phone] option 4.     Si Usted Necesita Algo Despus de Su Visita  Tambin puede enviarnos un mensaje a travs de Pharmacist, community. Por lo general respondemos a los mensajes de MyChart en el transcurso de 1 a 2 das hbiles.  Para renovar recetas, por favor pida a su farmacia que se ponga en contacto con nuestra oficina. Harland Dingwall de fax es [place] [phone].  Si tiene un asunto urgente cuando la clnica est cerrada y que no puede esperar hasta el siguiente da hbil, puede llamar/localizar a su doctor(a) al nmero que aparece a continuacin.   Por favor, tenga en cuenta que aunque hacemos todo lo posible para estar disponibles para asuntos urgentes fuera del horario de [place], no estamos disponibles las 24 horas del da, los 7 das de la [place].   Si tiene un problema urgente y no puede  comunicarse con nosotros, puede optar por buscar atencin mdica  en el consultorio de su doctor(a), en una clnica privada, en un centro de atencin urgente o en una sala de emergencias.  Si tiene Engineering geologist, por favor llame inmediatamente al 911 o vaya a la sala de emergencias.  Nmeros de bper  - Dr. Nehemiah Massed: [phone]  - Dra. Moye: [phone]  - Dra. Nicole Kindred: [phone]  En caso de inclemencias del [place], por favor llame a Johnsie Kindred principal al [phone] para una actualizacin sobre el [place] de cualquier retraso o cierre.  Consejos para la medicacin en dermatologa: Por favor, guarde las cajas en las que vienen los medicamentos de uso tpico para ayudarle a seguir las instrucciones sobre dnde y cmo usarlos. Las farmacias generalmente imprimen las instrucciones del medicamento slo en las cajas y no directamente en los tubos del [place].   Si su medicamento es muy caro, por favor, pngase en contacto con Zigmund Daniel llamando al [phone] y presione la opcin 4 o envenos un mensaje a travs de Pharmacist, community.   No podemos decirle cul ser su copago por los medicamentos por adelantado ya que esto es diferente dependiendo de la cobertura de su seguro. Sin embargo, es posible que podamos encontrar un medicamento sustituto a Electrical engineer un formulario para que el seguro cubra el medicamento que se considera necesario.   Si se requiere una autorizacin previa para que su compaa de seguros [place] su medicamento, por favor permtanos de 1 a 2 das hbiles para completar este proceso.  Los precios de los medicamentos varan con frecuencia dependiendo del Environmental consultant de dnde se surte la receta y alguna farmacias pueden ofrecer precios ms baratos.  El sitio web www.goodrx.com tiene cupones para medicamentos de Airline pilot. Los precios aqu no tienen en cuenta lo que podra costar con la ayuda del seguro (puede ser ms barato con su seguro), pero  el sitio web puede darle el precio si no utiliz Research scientist (physical sciences).  - Puede imprimir el cupn correspondiente y llevarlo con su receta a la farmacia.  - Tambin puede pasar por nuestra oficina durante el horario de atencin regular y Charity fundraiser una tarjeta de cupones de GoodRx.  - Si necesita que su receta se enve electrnicamente a Chiropodist, informe a nuestra oficina a travs de MyChart de [place] o por telfono llamando al [phone] y presione la  opcin 4.

## 2021-05-14 NOTE — Progress Notes (Signed)
Follow-Up Visit   Subjective  Beverly Cook is a 55 y.o. female who presents for the following: Follow-up (Patient here today for tbse. Reports a few dry spots at face she would like checked, a spot under right great toe. ). The patient presents for Total-Body Skin Exam (TBSE) for skin cancer screening and mole check.  The patient has spots, moles and lesions to be evaluated, some may be new or changing and the patient has concerns that these could be cancer.   The following portions of the chart were reviewed this encounter and updated as appropriate:  Tobacco  Allergies  Meds  Problems  Med Hx  Surg Hx  Fam Hx     Review of Systems: No other skin or systemic complaints except as noted in HPI or Assessment and Plan.  Objective  Well appearing patient in no apparent distress; mood and affect are within normal limits.  A full examination was performed including scalp, head, eyes, ears, nose, lips, neck, chest, axillae, abdomen, back, buttocks, bilateral upper extremities, bilateral lower extremities, hands, feet, fingers, toes, fingernails, and toenails. All findings within normal limits unless otherwise noted below.  forehead x12 (12) Erythematous thin papules/macules with gritty scale.   Assessment & Plan   Actinic keratosis (12) forehead x12  Actinic keratoses are precancerous spots that appear secondary to cumulative UV radiation exposure/sun exposure over time. They are chronic with expected duration over 1 year. A portion of actinic keratoses will progress to squamous cell carcinoma of the skin. It is not possible to reliably predict which spots will progress to skin cancer and so treatment is recommended to prevent development of skin cancer.  Recommend daily broad spectrum sunscreen SPF 30+ to sun-exposed areas, reapply every 2 hours as needed.  Recommend staying in the shade or wearing long sleeves, sun glasses (UVA+UVB protection) and wide brim hats (4-inch brim around  the entire circumference of the hat). Call for new or changing lesions.  Destruction of lesion - forehead x12 Complexity: simple   Destruction method: cryotherapy   Informed consent: discussed and consent obtained   Timeout:  patient name, date of birth, surgical site, and procedure verified Lesion destroyed using liquid nitrogen: Yes   Region frozen until ice ball extended beyond lesion: Yes   Outcome: patient tolerated procedure well with no complications   Post-procedure details: wound care instructions given   Additional details:  Prior to procedure, discussed risks of blister formation, small wound, skin dyspigmentation, or rare scar following cryotherapy. Recommend Vaseline ointment to treated areas while healing.   Skin cancer screening  Lentigines - Scattered tan macules - Due to sun exposure - Benign-appearing, observe - Recommend daily broad spectrum sunscreen SPF 30+ to sun-exposed areas, reapply every 2 hours as needed. - Call for any changes  Seborrheic Keratoses - Stuck-on, waxy, tan-brown papules and/or plaques at chest  - Benign-appearing - Discussed benign etiology and prognosis. - Observe - Call for any changes  Melanocytic Nevi - Tan-brown and/or pink-flesh-colored symmetric macules and papules  left buttock - Benign appearing on exam today - Observation - Call clinic for new or changing moles - Recommend daily use of broad spectrum spf 30+ sunscreen to sun-exposed areas.   Dermatofibroma - Firm pink/brown papulenodule with dimple sign right calf  - Benign appearing - Call for any changes  Hemangiomas - Red papules - Discussed benign nature - Observe - Call for any changes  Actinic Damage - Chronic condition, secondary to cumulative UV/sun exposure - diffuse scaly  erythematous macules with underlying dyspigmentation - Recommend daily broad spectrum sunscreen SPF 30+ to sun-exposed areas, reapply every 2 hours as needed.  - Staying in the shade  or wearing long sleeves, sun glasses (UVA+UVB protection) and wide brim hats (4-inch brim around the entire circumference of the hat) are also recommended for sun protection.  - Call for new or changing lesions.  Hx of atypical cells perianal area - S/P Surgery and Imiquimod topical treatment -  Used topical treatment,  GI did a biopsy, pt had surgery, and sigmoidoscopy. Clear per GI.  Clear on exam today. No persistence or recurrence. Observe; recheck next visit.  Skin cancer screening performed today.  Return in about 1 year (around 05/14/2022) for TBSE .  IRuthell Rummage, CMA, am acting as scribe for Sarina Ser, MD. Documentation: I have reviewed the above documentation for accuracy and completeness, and I agree with the above.  Sarina Ser, MD

## 2021-05-22 ENCOUNTER — Encounter: Payer: Self-pay | Admitting: Dermatology

## 2021-08-04 ENCOUNTER — Encounter: Payer: Self-pay | Admitting: Radiology

## 2021-10-18 ENCOUNTER — Other Ambulatory Visit: Payer: Self-pay | Admitting: Family Medicine

## 2021-10-18 DIAGNOSIS — R160 Hepatomegaly, not elsewhere classified: Secondary | ICD-10-CM

## 2021-10-18 NOTE — Progress Notes (Signed)
Ordering 6 month f/u liver imaging  ? ?Routing to MA to let pt know she can call outpatient [facility] imaging to schedule ?

## 2021-10-18 NOTE — Progress Notes (Signed)
Left VM (DPR) notifying pt of order and phone number to call.  ?

## 2021-10-24 ENCOUNTER — Ambulatory Visit
Admission: RE | Admit: 2021-10-24 | Discharge: 2021-10-24 | Disposition: A | Discharge status: Home / Self Care | Payer: Federal, State, Local not specified - PPO | Source: Ambulatory Visit | Attending: Family Medicine | Admitting: Family Medicine

## 2021-10-24 DIAGNOSIS — R160 Hepatomegaly, not elsewhere classified: Secondary | ICD-10-CM | POA: Diagnosis not present

## 2021-10-24 DIAGNOSIS — K7689 Other specified diseases of liver: Secondary | ICD-10-CM | POA: Diagnosis not present

## 2021-10-26 ENCOUNTER — Ambulatory Visit (INDEPENDENT_AMBULATORY_CARE_PROVIDER_SITE_OTHER): Payer: Federal, State, Local not specified - PPO | Admitting: Family Medicine

## 2021-10-26 VITALS — BP 100/70 | HR 76 | Temp 97.7°F | Ht 64.0 in | Wt 129.0 lb

## 2021-10-26 DIAGNOSIS — M47816 Spondylosis without myelopathy or radiculopathy, lumbar region: Secondary | ICD-10-CM | POA: Diagnosis not present

## 2021-10-26 DIAGNOSIS — E041 Nontoxic single thyroid nodule: Secondary | ICD-10-CM | POA: Diagnosis not present

## 2021-10-26 DIAGNOSIS — Z0000 Encounter for general adult medical examination without abnormal findings: Secondary | ICD-10-CM

## 2021-10-26 DIAGNOSIS — E782 Mixed hyperlipidemia: Secondary | ICD-10-CM

## 2021-10-26 LAB — LIPID PANEL
Cholesterol: 213 mg/dL — ABNORMAL HIGH (ref 0–200)
HDL: 66 mg/dL (ref >39.00)
LDL Cholesterol: 122 mg/dL — ABNORMAL HIGH (ref 0–99)
NonHDL: 146.86
Total CHOL/HDL Ratio: 3
Triglycerides: 124 mg/dL (ref 0.0–149.0)
VLDL: 24.8 mg/dL (ref 0.0–40.0)

## 2021-10-26 LAB — TSH: TSH: 0.81 u[IU]/mL (ref 0.35–5.50)

## 2021-10-26 MED ORDER — CYCLOBENZAPRINE HCL 5 MG PO TABS: ORAL_TABLET | ORAL | 1 refills | Status: DC

## 2021-10-26 NOTE — Patient Instructions (Signed)
Your cholesterol is high.  You can work to lower cholesterol through diet and exercise.  I would recommend considering the following:   1) Diet -Mediterranean diet, vegetarian or meat restricted diet, low carbohydrate diet, avoiding trans fats, or the Dash diet.   2) You can consider trying omega-3 fatty acids or red yeast rice which may have some benefit.  But dietary changes are better than supplements.  3) Exercise - I recommended 30 minutes of brisk walking 5-7 days a week    Would recommend the following for bone health:   1) 800 units of Vitamin D daily 2) Get 1200 mg of elemental calcium --- this is best from your diet. Try to track how much calcium you get on a typical day. You could find ways to add more (dairy products, leafy greens). Take a supplement for whatever you don't typically get so you reach 1200 mg of calcium.  3) Physical activity (ideally weight bearing) - like walking briskly 30 minutes 5 days a week.

## 2021-10-26 NOTE — Progress Notes (Signed)
Annual Exam   Chief Complaint:  Chief Complaint  Patient presents with   Annual Exam    No concerns    History of Present Illness:  [name] is a 56 y.o. G2P2 who LMP was No LMP recorded. Patient has had a hysterectomy., presents today for her annual examination.     Nutrition She does get adequate calcium and Vitamin D in her diet. Diet: healthy - salad Exercise: [employer] once a week, tennis and cycling, walking    Social History   Tobacco Use  Smoking Status Former   Packs/day: 0.25   Years: 30.00   Pack years: 7.50   Types: Cigarettes   Quit date: 09/19/2019   Years since quitting: 2.1  Smokeless Tobacco Never   Social History   Substance and Sexual Activity  Alcohol Use Yes   Alcohol/week: 1.0 standard drink   Types: 1 Glasses of wine per week   Comment: occasional-once a week, 3 beers   Social History   Substance and Sexual Activity  Drug Use No     General Health Dentist in the last year: Yes Eye doctor: yes  Safety The patient wears seatbelts: yes.     The patient feels safe at home and in their relationships: yes.   Menstrual:  Symptoms of menopause: hot flashes Tried estrogen and patch  GYN She is single partner, contraception - status post hysterectomy.    Cervical Cancer Screening (21-65):   S/p hysterectomy  Breast Cancer Screening (Age 66-74):  There is no FH of breast cancer. There is no FH of ovarian cancer. BRCA screening Not Indicated.  Last Mammogram: 10/2020 The patient does want a mammogram this year.    Colon Cancer Screening:  Age 57-75 yo - benefits outweigh the risk. Adults 1-85 yo who have never been screened benefit.  Benefits: 134000 people in 2016 will be diagnosed and 49,000 will die - early detection helps [place]: Complications 2/2 to colonoscopy High Risk (Colonoscopy): genetic disorder (Lynch syndrome or familial adenomatous polyposis), personal hx of IBD, previous adenomatous polyp, or previous  colorectal cancer, FamHx start 10 years before the age at diagnosis, increased in males and black race  Options:  FIT - looks for hemoglobin (blood in the stool) - specific and fairly sensitive - must be done annually Cologuard - looks for DNA and blood - more sensitive - therefore can have more false positives, every 3 years Colonoscopy - every 10 years if normal - sedation, bowl prep, must have someone drive you  Shared decision making and the patient had decided to do colonoscopy planning in August.   Social History   Tobacco Use  Smoking Status Former   Packs/day: 0.25   Years: 30.00   Pack years: 7.50   Types: Cigarettes   Quit date: 09/19/2019   Years since quitting: 2.1  Smokeless Tobacco Never    Lung Cancer Screening (Ages 63-87): not applicable  Weight Wt Readings from Last 3 Encounters:  10/26/21 129 lb (58.5 kg)  04/16/21 129 lb 8 oz (58.7 kg)  11/15/20 128 lb (58.1 kg)   Patient has normal BMI  BMI Readings from Last 1 Encounters:  10/26/21 22.14 kg/m     Chronic disease screening Blood pressure monitoring:  BP Readings from Last 3 Encounters:  10/26/21 100/70  04/16/21 110/70  11/15/20 100/60    Lipid Monitoring: Indication for screening: age >13, obesity, diabetes, family hx, CV risk factors.  Lipid screening: Yes  Lab Results  Component Value Date  CHOL 218 (H) 09/20/2020   HDL 72.20 09/20/2020   LDLCALC 127 (H) 09/20/2020   TRIG 95.0 09/20/2020   CHOLHDL 3 09/20/2020     Diabetes Screening: age >26, overweight, family hx, PCOS, hx of gestational diabetes, at risk ethnicity Diabetes Screening screening: Not Indicated  Lab Results  Component Value Date   HGBA1C 5.3 07/17/2017     Past Medical History:  Diagnosis Date   Depression    Diverticulosis    Hyperthyroidism    Hyperthyroidism    Lesion of rectum    Osteopenia    Seasonal affective disorder ([place])    Thyroid disease    HYPOTHYRIODISM /GOITER    Past Surgical  History:  Procedure Laterality Date   ABDOMINAL HYSTERECTOMY  2009   T V[facility]   AUGMENTATION MAMMAPLASTY Bilateral 2005   BILATERAL SALPINGOOPHORECTOMY     BREAST ENHANCEMENT SURGERY Bilateral 05/2003   BREAST SURGERY  2004   BREAST AUGMENTATION   COLONOSCOPY     FLEXIBLE SIGMOIDOSCOPY N/A 06/30/2015   Procedure: FLEXIBLE SIGMOIDOSCOPY;  Surgeon: Lollie Sails, MD;  Location: [facility] ENDOSCOPY;  Service: Endoscopy;  Laterality: N/A;   FLEXIBLE SIGMOIDOSCOPY N/A 09/05/2016   Procedure: FLEXIBLE SIGMOIDOSCOPY;  Surgeon: Lollie Sails, MD;  Location: [facility] ENDOSCOPY;  Service: Endoscopy;  Laterality: N/A;   FLEXIBLE SIGMOIDOSCOPY N/A 12/21/2018   Procedure: FLEXIBLE SIGMOIDOSCOPY;  Surgeon: Lollie Sails, MD;  Location: [facility] ENDOSCOPY;  Service: Endoscopy;  Laterality: N/A;   RECTAL SURGERY  2017   WISDOM TOOTH EXTRACTION      Prior to Admission medications   Medication Sig Start Date End Date Taking? Authorizing Provider  Calcium Carb-Cholecalciferol (HM CALCIUM-VITAMIN D PO) Take by mouth. 600-20 mcg 1 daily   Yes [provider]  cyclobenzaprine (FLEXERIL) 5 MG tablet TAKE 1 TABLET BY MOUTH THREE TIMES A DAY AS NEEDED FOR MUSCLE SPASMS 09/30/19  Yes Lesleigh Noe, MD  famotidine (PEPCID) 20 MG tablet Take 20 mg by mouth daily as needed for heartburn or indigestion.   Yes [provider]  meloxicam (MOBIC) 15 MG tablet Take 7.5 mg by mouth as needed for pain.   Yes [provider]  Turmeric (QC TUMERIC COMPLEX PO) Take 750 mg by mouth daily at 12 noon.   Yes [provider]    No Known Allergies  Gynecologic History: No LMP recorded. Patient has had a hysterectomy.  Obstetric History: G2P2  Social History   Socioeconomic History   Marital status: Married    Spouse name: Beverly Cook   Number of children: 2   Years of education: bachelors   Highest education level: Not on file  Occupational History   Not on file  Tobacco Use   Smoking status:  Former    Packs/day: 0.25    Years: 30.00    Pack years: 7.50    Types: Cigarettes    Quit date: 09/19/2019    Years since quitting: 2.1   Smokeless tobacco: Never  Vaping Use   Vaping Use: Never used  Substance and Sexual Activity   Alcohol use: Yes    Alcohol/week: 1.0 standard drink    Types: 1 Glasses of wine per week    Comment: occasional-once a week, 3 beers   Drug use: No   Sexual activity: Yes    Partners: Male    Birth control/protection: Surgical  Other Topics Concern   Not on file  Social History Narrative   Lives with Beverly Cook   Has 2 children - Crystal and  Williams Che has 3 children   Enjoys: bicycle, tennis, flipping housing   Exercise: goes to [facility] [employer] 2-3 days a week   Diet: pretty good overall, does eat junk food   Social Determinants of Radio broadcast assistant Strain: Not on file  Food Insecurity: Not on file  Transportation Needs: Not on file  Physical Activity: Not on file  Stress: Not on file  Social Connections: Not on file  Intimate Partner Violence: Not on file    Family History  Problem Relation Age of Onset   Hypertension Mother    Hyperlipidemia Mother    Hypertension Father    Hyperlipidemia Father    Melanoma Father    Breast cancer Paternal Aunt 53   Diabetes Maternal Grandmother    Breast cancer Paternal Aunt 58   Breast cancer Cousin 69   Breast cancer Paternal Aunt 72   Breast cancer Cousin 5   Healthy Sister    Healthy Brother    Healthy Brother     Review of Systems  Constitutional:  Negative for chills and fever.  HENT:  Negative for congestion and sore throat.   Eyes:  Negative for blurred vision and double vision.  Respiratory:  Negative for shortness of breath.   Cardiovascular:  Negative for chest pain.  Gastrointestinal:  Negative for heartburn, nausea and vomiting.  Genitourinary: Negative.   Musculoskeletal: Negative.  Negative for myalgias.  Skin:  Negative for rash.  Neurological:  Negative for dizziness  and headaches.  Endo/Heme/Allergies:  Does not bruise/bleed easily.  Psychiatric/Behavioral:  Negative for depression. The patient is not nervous/anxious.     Physical Exam BP 100/70   Pulse 76   Temp 97.7 F (36.5 C) (Temporal)   Ht 5' 4"  (1.626 m)   Wt 129 lb (58.5 kg)   SpO2 98%   BMI 22.14 kg/m    BP Readings from Last 3 Encounters:  10/26/21 100/70  04/16/21 110/70  11/15/20 100/60      Physical Exam Constitutional:      General: She is not in acute distress.    Appearance: She is well-developed. She is not diaphoretic.  HENT:     Head: Normocephalic and atraumatic.     Right Ear: External ear normal.     Left Ear: External ear normal.     Nose: Nose normal.  Eyes:     General: No scleral icterus.    Extraocular Movements: Extraocular movements intact.     Conjunctiva/sclera: Conjunctivae normal.  Cardiovascular:     Rate and Rhythm: Normal rate and regular rhythm.     Heart sounds: No murmur heard. Pulmonary:     Effort: Pulmonary effort is normal. No respiratory distress.     Breath sounds: Normal breath sounds. No wheezing.  Abdominal:     General: Bowel sounds are normal. There is no distension.     Palpations: Abdomen is soft. There is no mass.     Tenderness: There is no abdominal tenderness. There is no guarding or rebound.  Musculoskeletal:        General: Normal range of motion.     Cervical back: Neck supple.  Lymphadenopathy:     Cervical: No cervical adenopathy.  Skin:    General: Skin is warm and dry.     Capillary Refill: Capillary refill takes less than 2 seconds.  Neurological:     Mental Status: She is alert and oriented to person, place, and time.     Deep Tendon Reflexes: Reflexes  normal.  Psychiatric:        Mood and Affect: Mood normal.        Behavior: Behavior normal.    Results:  PHQ-9:  [place] Office Visit from 08/17/2018 in [place] at [place]  PHQ-9 Total Score 1         Assessment: 56 y.o.  G2P2 female here for routine annual physical examination.  Plan: Problem List Items Addressed This Visit       Endocrine   Thyroid nodule   Relevant Orders   TSH     Musculoskeletal and Integument   Spondylosis of lumbar region without myelopathy or radiculopathy   Relevant Medications   cyclobenzaprine (FLEXERIL) 5 MG tablet     Other   HLD (hyperlipidemia)   Relevant Orders   Lipid panel   Other Visit Diagnoses     Annual physical exam    -  Primary       Screening: -- Blood pressure screen normal -- cholesterol screening: will obtain -- Weight screening: normal -- Diabetes Screening: not due for screening -- Nutrition: Encouraged healthy diet  The 10-year ASCVD risk score (Arnett DK, et al., 2019) is: 1.5%   Values used to calculate the score:     Age: 47 years     Sex: Female     Is Non-Hispanic African American: No     Diabetic: No     Tobacco smoker: No     Systolic Blood Pressure: 886 mmHg     Is BP treated: No     HDL Cholesterol: 72.2 mg/dL     Total Cholesterol: 218 mg/dL  -- Statin therapy for Age 3-75 with CVD risk >7.5%  Psych -- Depression screening (PHQ-9):  [place] Visit from 08/17/2018 in [place] at [place]  PHQ-9 Total Score 1        Safety -- tobacco screening: not using -- alcohol screening:  low-risk usage. -- no evidence of domestic violence or intimate partner violence.   Cancer Screening -- pap smear not collected per ASCCP guidelines -- family history of breast cancer screening: done. not at high risk. -- Mammogram - ordered -- Colon cancer (age 25+)--  scheduled  Immunizations Immunization History  Administered Date(s) Administered   Fluad Quad(high Dose 65+) 02/03/2019   Influenza-Unspecified 02/12/2017, 03/29/2021   PFIZER(Purple Top)SARS-COV-2 Vaccination 08/06/2019, 08/27/2019   Tdap 08/12/2017    -- flu vaccine up to date -- TDAP q10 years up to date -- Shingles (age >50) not up  to date - she will plan to get -- Covid-19 Vaccine up to date   Encouraged healthy diet and exercise. Encouraged regular vision and dental care.    Lesleigh Noe, MD

## 2021-10-29 ENCOUNTER — Ambulatory Visit
Admission: RE | Admit: 2021-10-29 | Discharge: 2021-10-29 | Disposition: A | Discharge status: Home / Self Care | Payer: Federal, State, Local not specified - PPO | Source: Ambulatory Visit | Attending: Family Medicine | Admitting: Family Medicine

## 2021-10-29 ENCOUNTER — Encounter: Payer: Self-pay | Admitting: Family Medicine

## 2021-10-29 DIAGNOSIS — Z1231 Encounter for screening mammogram for malignant neoplasm of breast: Secondary | ICD-10-CM | POA: Insufficient documentation

## 2021-10-31 ENCOUNTER — Ambulatory Visit: Payer: Federal, State, Local not specified - PPO | Admitting: Family Medicine

## 2021-11-17 ENCOUNTER — Telehealth: Payer: Self-pay | Admitting: Family Medicine

## 2021-11-17 DIAGNOSIS — M47816 Spondylosis without myelopathy or radiculopathy, lumbar region: Secondary | ICD-10-CM

## 2021-11-19 NOTE — Telephone Encounter (Signed)
Last filled 10/26/21 #30 with 1 refill

## 2021-11-19 NOTE — Telephone Encounter (Signed)
Please have pt schedule f/u visit for back pain with me or Dr. Lorelei Pont if she is continuing to need flexeril at this frequency.

## 2021-11-21 NOTE — Telephone Encounter (Signed)
Pt called back and said medication no longer needed

## 2021-11-21 NOTE — Telephone Encounter (Signed)
Lvm for pt to call back and schedule if medication needed

## 2022-01-08 DIAGNOSIS — A630 Anogenital (venereal) warts: Secondary | ICD-10-CM | POA: Diagnosis not present

## 2022-01-08 DIAGNOSIS — K219 Gastro-esophageal reflux disease without esophagitis: Secondary | ICD-10-CM | POA: Diagnosis not present

## 2022-01-08 DIAGNOSIS — R0789 Other chest pain: Secondary | ICD-10-CM | POA: Diagnosis not present

## 2022-01-08 DIAGNOSIS — K5730 Diverticulosis of large intestine without perforation or abscess without bleeding: Secondary | ICD-10-CM | POA: Diagnosis not present

## 2022-01-22 ENCOUNTER — Encounter: Payer: Self-pay | Admitting: Dermatology

## 2022-01-23 MED ORDER — TRETINOIN 0.05 % EX CREA: TOPICAL_CREAM | CUTANEOUS | 3 refills | Status: AC

## 2022-02-15 DIAGNOSIS — K2970 Gastritis, unspecified, without bleeding: Secondary | ICD-10-CM | POA: Diagnosis not present

## 2022-02-15 DIAGNOSIS — K640 First degree hemorrhoids: Secondary | ICD-10-CM | POA: Diagnosis not present

## 2022-02-15 DIAGNOSIS — K635 Polyp of colon: Secondary | ICD-10-CM | POA: Diagnosis not present

## 2022-02-15 DIAGNOSIS — Z86010 Personal history of colonic polyps: Secondary | ICD-10-CM | POA: Diagnosis not present

## 2022-02-15 DIAGNOSIS — K219 Gastro-esophageal reflux disease without esophagitis: Secondary | ICD-10-CM | POA: Diagnosis not present

## 2022-02-15 DIAGNOSIS — Z1211 Encounter for screening for malignant neoplasm of colon: Secondary | ICD-10-CM | POA: Diagnosis not present

## 2022-02-15 DIAGNOSIS — D124 Benign neoplasm of descending colon: Secondary | ICD-10-CM | POA: Diagnosis not present

## 2022-04-17 ENCOUNTER — Telehealth: Payer: Federal, State, Local not specified - PPO | Admitting: Physician Assistant

## 2022-04-17 DIAGNOSIS — R3989 Other symptoms and signs involving the genitourinary system: Secondary | ICD-10-CM | POA: Diagnosis not present

## 2022-04-17 MED ORDER — CEPHALEXIN 500 MG PO CAPS: 500.0000 mg | ORAL_CAPSULE | Freq: Two times a day (BID) | ORAL | 0 refills | Status: DC

## 2022-04-17 NOTE — Progress Notes (Signed)

## 2022-05-13 ENCOUNTER — Ambulatory Visit: Payer: Federal, State, Local not specified - PPO | Admitting: Dermatology

## 2022-05-13 VITALS — BP 116/69 | HR 76

## 2022-05-13 DIAGNOSIS — L720 Epidermal cyst: Secondary | ICD-10-CM

## 2022-05-13 DIAGNOSIS — L570 Actinic keratosis: Secondary | ICD-10-CM | POA: Diagnosis not present

## 2022-05-13 DIAGNOSIS — L578 Other skin changes due to chronic exposure to nonionizing radiation: Secondary | ICD-10-CM

## 2022-05-13 DIAGNOSIS — L814 Other melanin hyperpigmentation: Secondary | ICD-10-CM | POA: Diagnosis not present

## 2022-05-13 DIAGNOSIS — Z1283 Encounter for screening for malignant neoplasm of skin: Secondary | ICD-10-CM

## 2022-05-13 DIAGNOSIS — D229 Melanocytic nevi, unspecified: Secondary | ICD-10-CM

## 2022-05-13 DIAGNOSIS — L821 Other seborrheic keratosis: Secondary | ICD-10-CM

## 2022-05-13 NOTE — Progress Notes (Signed)
   Follow-Up Visit   Subjective  Beverly Cook is a 56 y.o. female who presents for the following: Annual Exam (History of AK - The patient presents for Total-Body Skin Exam (TBSE) for skin cancer screening and mole check.  The patient has spots, moles and lesions to be evaluated, some may be new or changing and the patient has concerns that these could be cancer./).  The following portions of the chart were reviewed this encounter and updated as appropriate:   Tobacco  Allergies  Meds  Problems  Med Hx  Surg Hx  Fam Hx     Review of Systems:  No other skin or systemic complaints except as noted in HPI or Assessment and Plan.  Objective  Well appearing patient in no apparent distress; mood and affect are within normal limits.  A full examination was performed including scalp, head, eyes, ears, nose, lips, neck, chest, axillae, abdomen, back, buttocks, bilateral upper extremities, bilateral lower extremities, hands, feet, fingers, toes, fingernails, and toenails. All findings within normal limits unless otherwise noted below.  Face Smooth white papule(s).   Right cheek Erythematous thin papules/macules with gritty scale.    Assessment & Plan   Lentigines - Scattered tan macules - Due to sun exposure - Benign-appearing, observe - Recommend daily broad spectrum sunscreen SPF 30+ to sun-exposed areas, reapply every 2 hours as needed. - Call for any changes  Seborrheic Keratoses - Stuck-on, waxy, tan-brown papules and/or plaques  - Benign-appearing - Discussed benign etiology and prognosis. - Observe - Call for any changes  Melanocytic Nevi - Tan-brown and/or pink-flesh-colored symmetric macules and papules - Benign appearing on exam today - Observation - Call clinic for new or changing moles - Recommend daily use of broad spectrum spf 30+ sunscreen to sun-exposed areas.   Actinic Damage - Chronic condition, secondary to cumulative UV/sun exposure - diffuse scaly  erythematous macules with underlying dyspigmentation - Recommend daily broad spectrum sunscreen SPF 30+ to sun-exposed areas, reapply every 2 hours as needed.  - Staying in the shade or wearing long sleeves, sun glasses (UVA+UVB protection) and wide brim hats (4-inch brim around the entire circumference of the hat) are also recommended for sun protection.  - Call for new or changing lesions.  Skin cancer screening performed today.  [place] Face Benign-appearing.  Observation.  Call clinic for new or changing lesions.  Recommend daily use of broad spectrum spf 30+ sunscreen to sun-exposed areas.   AK (actinic keratosis) Right cheek Destruction of lesion - Right cheek Complexity: simple   Destruction method: cryotherapy   Informed consent: discussed and consent obtained   Timeout:  patient name, date of birth, surgical site, and procedure verified Lesion destroyed using liquid nitrogen: Yes   Region frozen until ice ball extended beyond lesion: Yes   Outcome: patient tolerated procedure well with no complications   Post-procedure details: wound care instructions given    Return in about 1 year (around 05/14/2023).  I, Ashok Cordia, CMA, am acting as scribe for Sarina Ser, MD . Documentation: I have reviewed the above documentation for accuracy and completeness, and I agree with the above.  Sarina Ser, MD

## 2022-05-13 NOTE — Patient Instructions (Signed)
Cryotherapy Aftercare  Wash gently with soap and water everyday.   Apply Vaseline and Band-Aid daily until healed.     Due to recent changes in healthcare laws, you may see results of your pathology and/or laboratory studies on MyChart before the doctors have had a chance to review them. We understand that in some cases there may be results that are confusing or concerning to you. Please understand that not all results are received at the same time and often the doctors may need to interpret multiple results in order to provide you with the best plan of care or course of treatment. Therefore, we ask that you please give us 2 business days to thoroughly review all your results before contacting the office for clarification. Should we see a critical lab result, you will be contacted sooner.   If You Need Anything After Your Visit  If you have any questions or concerns for your doctor, please call our main line at [phone] and press option 4 to reach your doctor's medical assistant. If no one answers, please leave a voicemail as directed and we will return your call as soon as possible. Messages left after 4 pm will be answered the following business day.   You may also send us a message via MyChart. We typically respond to MyChart messages within 1-2 business days.  For prescription refills, please ask your pharmacy to contact our office. Our fax number is [phone].  If you have an urgent issue when the clinic is closed that cannot wait until the next business day, you can page your doctor at the number below.    Please note that while we do our best to be available for urgent issues outside of office hours, we are not available 24/7.   If you have an urgent issue and are unable to reach us, you may choose to seek medical care at your doctor's office, retail clinic, urgent care center, or emergency room.  If you have a medical emergency, please immediately call 911 or go to the  emergency department.  Pager Numbers  - Dr. Kowalski: [phone]  - Dr. Moye: [phone]  - Dr. Stewart: [phone]  In the event of inclement weather, please call our main line at [phone] for an update on the status of any delays or closures.  Dermatology Medication Tips: Please keep the boxes that topical medications come in in order to help keep track of the instructions about where and how to use these. Pharmacies typically print the medication instructions only on the boxes and not directly on the medication tubes.   If your medication is too expensive, please contact our office at [phone] option 4 or send us a message through MyChart.   We are unable to tell what your co-pay for medications will be in advance as this is different depending on your insurance coverage. However, we may be able to find a substitute medication at lower cost or fill out paperwork to get insurance to cover a needed medication.   If a prior authorization is required to get your medication covered by your insurance company, please allow us 1-2 business days to complete this process.  Drug prices often vary depending on where the prescription is filled and some pharmacies may offer cheaper prices.  The website www.goodrx.com contains coupons for medications through different pharmacies. The prices here do not account for what the cost may be with help from insurance (it may be cheaper with your insurance), but the website can   give you the price if you did not use any insurance.  - You can print the associated coupon and take it with your prescription to the pharmacy.  - You may also stop by our office during regular business hours and pick up a GoodRx coupon card.  - If you need your prescription sent electronically to a different pharmacy, notify our office through [facility] MyChart or by phone at [phone] option 4.     Si Usted Necesita Algo Despus de Su Visita  Tambin puede  enviarnos un mensaje a travs de MyChart. Por lo general respondemos a los mensajes de MyChart en el transcurso de 1 a 2 das hbiles.  Para renovar recetas, por favor pida a su farmacia que se ponga en contacto con nuestra oficina. Nuestro nmero de fax es el [phone].  Si tiene un asunto urgente cuando la clnica est cerrada y que no puede esperar hasta el siguiente da hbil, puede llamar/localizar a su doctor(a) al nmero que aparece a continuacin.   Por favor, tenga en cuenta que aunque hacemos todo lo posible para estar disponibles para asuntos urgentes fuera del horario de oficina, no estamos disponibles las 24 horas del da, los 7 das de la semana.   Si tiene un problema urgente y no puede comunicarse con nosotros, puede optar por buscar atencin mdica  en el consultorio de su doctor(a), en una clnica privada, en un centro de atencin urgente o en una sala de emergencias.  Si tiene una emergencia mdica, por favor llame inmediatamente al 911 o vaya a la sala de emergencias.  Nmeros de bper  - Dr. Kowalski: [phone]  - Dra. Moye: [phone]  - Dra. Stewart: [phone]  En caso de inclemencias del tiempo, por favor llame a nuestra lnea principal al [phone] para una actualizacin sobre el estado de cualquier retraso o cierre.  Consejos para la medicacin en dermatologa: Por favor, guarde las cajas en las que vienen los medicamentos de uso tpico para ayudarle a seguir las instrucciones sobre dnde y cmo usarlos. Las farmacias generalmente imprimen las instrucciones del medicamento slo en las cajas y no directamente en los tubos del medicamento.   Si su medicamento es muy caro, por favor, pngase en contacto con nuestra oficina llamando al [phone] y presione la opcin 4 o envenos un mensaje a travs de MyChart.   No podemos decirle cul ser su copago por los medicamentos por adelantado ya que esto es diferente dependiendo de la cobertura de su seguro.  Sin embargo, es posible que podamos encontrar un medicamento sustituto a menor costo o llenar un formulario para que el seguro cubra el medicamento que se considera necesario.   Si se requiere una autorizacin previa para que su compaa de seguros cubra su medicamento, por favor permtanos de 1 a 2 das hbiles para completar este proceso.  Los precios de los medicamentos varan con frecuencia dependiendo del lugar de dnde se surte la receta y alguna farmacias pueden ofrecer precios ms baratos.  El sitio web www.goodrx.com tiene cupones para medicamentos de diferentes farmacias. Los precios aqu no tienen en cuenta lo que podra costar con la ayuda del seguro (puede ser ms barato con su seguro), pero el sitio web puede darle el precio si no utiliz ningn seguro.  - Puede imprimir el cupn correspondiente y llevarlo con su receta a la farmacia.  - Tambin puede pasar por nuestra oficina durante el horario de atencin regular y recoger una tarjeta de cupones de GoodRx.  -   Si necesita que su receta se enve electrnicamente a una farmacia diferente, informe a nuestra oficina a travs de MyChart de [facility] o por telfono llamando al [phone] y presione la opcin 4.  

## 2022-05-25 ENCOUNTER — Encounter: Payer: Self-pay | Admitting: Dermatology

## 2022-07-09 NOTE — Progress Notes (Unsigned)
Cardiology Office Note  Date:  07/10/2022   ID:  Beverly Cook, DOB 04-Jan-1966, MRN [id number]  PCP:  Waunita Schooner, MD   Chief Complaint  Patient presents with   Occasional Heart Fluttering    Patient is doing well today with no complaints or concerns. Meds reviewed with patient.     HPI:  [name] is a 57 year old woman with past medical history of [place] Hyperlipidemia Previous episodes of palpitations Previous episode of chest pain in 2018 that woke her from sleep  Normal stress echocardiogram 2018 CT calcium score 2019 was zero Who presents by referral from Dr. Waunita Schooner for heart fluttering   Previously seen in clinic 2019 for episodes of chest pain In follow-up today she reports having 3 episodes of chest discomfort Described as a Strong heavy pushing on chest Typically presents at rest When she developed symptoms, she stopped activity Eased on its own Less than 5 minutes  Goes to gym , exercises on the elliptical  Able to get heart rate higher than 150 without symptoms of chest pain  Taking pepcid regularly for GERD symptoms Eating late some nights Completed EGD  Also has a brief gated occasional heart fluttering when she lays down in bed lasting for a minute, resolves without intervention, unclear if it is irregular  CT calcium score 2019 of 0  Ports that she quit smoking 2 years ago  EKG personally reviewed by myself on todays visit Normal sinus rhythm rate 65 bpm no significant ST-T wave changes  PMH:   has a past medical history of Depression, Diverticulosis, Hyperthyroidism, Hyperthyroidism, Lesion of rectum, Osteopenia, Seasonal affective disorder ([place]), and Thyroid disease.  PSH:    Past Surgical History:  Procedure Laterality Date   ABDOMINAL HYSTERECTOMY  2009   T VH   AUGMENTATION MAMMAPLASTY Bilateral 2005   BILATERAL SALPINGOOPHORECTOMY     BREAST ENHANCEMENT SURGERY Bilateral 05/2003   BREAST SURGERY  2004   BREAST  AUGMENTATION   COLONOSCOPY     FLEXIBLE SIGMOIDOSCOPY N/A 06/30/2015   Procedure: FLEXIBLE SIGMOIDOSCOPY;  Surgeon: Lollie Sails, MD;  Location: [facility] ENDOSCOPY;  Service: Endoscopy;  Laterality: N/A;   FLEXIBLE SIGMOIDOSCOPY N/A 09/05/2016   Procedure: FLEXIBLE SIGMOIDOSCOPY;  Surgeon: Lollie Sails, MD;  Location: [facility] ENDOSCOPY;  Service: Endoscopy;  Laterality: N/A;   FLEXIBLE SIGMOIDOSCOPY N/A 12/21/2018   Procedure: FLEXIBLE SIGMOIDOSCOPY;  Surgeon: Lollie Sails, MD;  Location: [facility] ENDOSCOPY;  Service: Endoscopy;  Laterality: N/A;   RECTAL SURGERY  2017   WISDOM TOOTH EXTRACTION      Current Outpatient Medications  Medication Sig Dispense Refill   Calcium Carb-Cholecalciferol (HM CALCIUM-VITAMIN D PO) Take by mouth. 600-20 mcg 1 daily     cyclobenzaprine (FLEXERIL) 5 MG tablet TAKE 1 TABLET BY MOUTH THREE TIMES A DAY AS NEEDED FOR MUSCLE SPASMS 30 tablet 1   famotidine (PEPCID) 20 MG tablet Take 20 mg by mouth daily as needed for heartburn or indigestion.     meloxicam (MOBIC) 15 MG tablet Take 7.5 mg by mouth as needed for pain.     tretinoin (RETIN-A) 0.05 % cream Apply a thin coat to the entire face QHS. (Patient taking differently: every morning. Apply a thin coat to the entire face QHS.) 45 g 3   Turmeric (QC TUMERIC COMPLEX PO) Take 750 mg by mouth daily at 12 noon.     cephALEXin (KEFLEX) 500 MG capsule Take 1 capsule (500 mg total) by mouth 2 (two) times daily. 14 capsule 0  No current facility-administered medications for this visit.    Allergies:   Patient has no known allergies.   Social History:  The patient  reports that she quit smoking about 2 years ago. Her smoking use included cigarettes. She has a 7.50 pack-year smoking history. She has never used smokeless tobacco. She reports current alcohol use of about 1.0 standard drink of alcohol per week. She reports that she does not use drugs.   Family History:   family history includes Breast cancer (age of  onset: 65) in her paternal aunt; Breast cancer (age of onset: 91) in her cousin, cousin, paternal aunt, and paternal aunt; Diabetes in her maternal grandmother; Healthy in her brother, brother, and sister; Hyperlipidemia in her father and mother; Hypertension in her father and mother; Melanoma in her father.    Review of Systems: Review of Systems  Constitutional: Negative.   HENT: Negative.    Respiratory: Negative.    Cardiovascular:  Positive for chest pain and palpitations.  Gastrointestinal: Negative.   Musculoskeletal: Negative.   Neurological: Negative.   Psychiatric/Behavioral: Negative.    All other systems reviewed and are negative.    PHYSICAL EXAM: VS:  BP 118/80 (BP Location: Left Arm, Patient Position: Sitting, Cuff Size: Normal)   Pulse 65   Ht 5' 4.5" (1.638 m)   Wt 131 lb 12.8 oz (59.8 kg)   SpO2 99%   BMI 22.27 kg/m  , BMI Body mass index is 22.27 kg/m. GEN: Well nourished, well developed, in no acute distress HEENT: normal Neck: no JVD, carotid bruits, or masses Cardiac: RRR; no murmurs, rubs, or gallops,no edema  Respiratory:  clear to auscultation bilaterally, normal work of breathing GI: soft, nontender, nondistended, + BS MS: no deformity or atrophy Skin: warm and dry, no rash Neuro:  Strength and sensation are intact Psych: euthymic mood, full affect   Recent Labs: 10/26/2021: TSH 0.81    Lipid Panel Lab Results  Component Value Date   CHOL 213 (H) 10/26/2021   HDL 66.00 10/26/2021   LDLCALC 122 (H) 10/26/2021   TRIG 124.0 10/26/2021      Wt Readings from Last 3 Encounters:  07/10/22 131 lb 12.8 oz (59.8 kg)  10/26/21 129 lb (58.5 kg)  04/16/21 129 lb 8 oz (58.7 kg)       ASSESSMENT AND PLAN:  Problem List Items Addressed This Visit       Cardiology Problems   HLD (hyperlipidemia)     Other   Other chest pain - Primary   Former smoker   Other Visit Diagnoses     Fluttering heart          Chest pain Atypical in  nature, presenting at rest, not when exercising at the gym Symptoms resolved without intervention Prior calcium score of 0 Normal EKG and clinical exam No further testing ordered at this time We have recommended if she continues to have chest pain symptoms especially with exertion or increasing frequency, additional testing could be performed such as cardiac CTA Risk factors for coronary disease including hyperlipidemia, prior smoking history  History of smoking Reports that she stopped smoking 2 years ago  Hyperlipidemia Currently not on a statin, calcium score of 0 in 2019  Palpitations Presenting at night lasting less than 1 minute, resolved without intervention Recommended home monitoring system such as Kardia mobile device If irregular concerning for atrial fibrillation, Zio monitor could be ordered Typically does not appreciate tachypalpitations in the daytime    Total encounter time more  than 50 minutes  Greater than 50% was spent in counseling and coordination of care with the patient    Signed, Esmond Plants, M.D., Ph.D. [place], [place]

## 2022-07-10 ENCOUNTER — Encounter: Payer: Self-pay | Admitting: Cardiovascular Disease

## 2022-07-10 ENCOUNTER — Ambulatory Visit: Payer: Federal, State, Local not specified - PPO | Attending: Cardiovascular Disease | Admitting: Cardiovascular Disease

## 2022-07-10 VITALS — BP 118/80 | HR 65 | Ht 64.5 in | Wt 131.8 lb

## 2022-07-10 DIAGNOSIS — Z87891 Personal history of nicotine dependence: Secondary | ICD-10-CM | POA: Diagnosis not present

## 2022-07-10 DIAGNOSIS — E782 Mixed hyperlipidemia: Secondary | ICD-10-CM | POA: Diagnosis not present

## 2022-07-10 DIAGNOSIS — I498 Other specified cardiac arrhythmias: Secondary | ICD-10-CM | POA: Diagnosis not present

## 2022-07-10 DIAGNOSIS — R0789 Other chest pain: Secondary | ICD-10-CM | POA: Diagnosis not present

## 2022-07-10 NOTE — Patient Instructions (Signed)
Medication Instructions:  No changes  If you need a refill on your cardiac medications before your next appointment, please call your pharmacy.   Lab work: No new labs needed  Testing/Procedures: No new testing needed  Follow-Up: At [facility], you and your health needs are our priority.  As part of our continuing mission to provide you with exceptional heart care, we have created designated Provider Care Teams.  These Care Teams include your primary Cardiologist (physician) and Advanced Practice Providers (APPs -  Physician Assistants and Nurse Practitioners) who all work together to provide you with the care you need, when you need it.  You will need a follow up appointment as needed  Providers on your designated Care Team:   Christopher Berge, NP Ryan Dunn, PA-C Cadence Furth, PA-C  COVID-19 Vaccine Information can be found at: [email or web address] For questions related to vaccine distribution or appointments, please email [email or web address] or call [phone].    

## 2022-10-02 ENCOUNTER — Encounter: Payer: Self-pay | Admitting: Nurse Practitioner

## 2022-10-02 MED ORDER — MELOXICAM 15 MG PO TABS: 7.5000 mg | ORAL_TABLET | ORAL | 0 refills | Status: DC | PRN

## 2022-10-02 NOTE — Addendum Note (Signed)
Addended by: Eden Emms on: 10/02/2022 04:59 PM   Modules accepted: Orders

## 2022-10-24 ENCOUNTER — Ambulatory Visit: Payer: Federal, State, Local not specified - PPO | Admitting: Nurse Practitioner

## 2022-10-24 ENCOUNTER — Encounter: Payer: Self-pay | Admitting: Nurse Practitioner

## 2022-10-24 ENCOUNTER — Encounter: Payer: Self-pay | Admitting: *Deleted

## 2022-10-24 VITALS — BP 116/70 | HR 72 | Temp 97.8°F | Resp 16 | Ht 64.5 in | Wt 130.2 lb

## 2022-10-24 DIAGNOSIS — R208 Other disturbances of skin sensation: Secondary | ICD-10-CM | POA: Diagnosis not present

## 2022-10-24 DIAGNOSIS — R12 Heartburn: Secondary | ICD-10-CM

## 2022-10-24 DIAGNOSIS — Z87891 Personal history of nicotine dependence: Secondary | ICD-10-CM

## 2022-10-24 DIAGNOSIS — E559 Vitamin D deficiency, unspecified: Secondary | ICD-10-CM | POA: Diagnosis not present

## 2022-10-24 DIAGNOSIS — M47816 Spondylosis without myelopathy or radiculopathy, lumbar region: Secondary | ICD-10-CM

## 2022-10-24 DIAGNOSIS — N951 Menopausal and female climacteric states: Secondary | ICD-10-CM | POA: Diagnosis not present

## 2022-10-24 DIAGNOSIS — Z1231 Encounter for screening mammogram for malignant neoplasm of breast: Secondary | ICD-10-CM

## 2022-10-24 DIAGNOSIS — E785 Hyperlipidemia, unspecified: Secondary | ICD-10-CM

## 2022-10-24 DIAGNOSIS — R160 Hepatomegaly, not elsewhere classified: Secondary | ICD-10-CM

## 2022-10-24 DIAGNOSIS — Z7689 Persons encountering health services in other specified circumstances: Secondary | ICD-10-CM

## 2022-10-24 NOTE — Assessment & Plan Note (Signed)
History of the same.  She will use meloxicam as needed.  Turmeric over-the-counter does help with her pain

## 2022-10-24 NOTE — Assessment & Plan Note (Signed)
Former smoker does not qualify for LDCT.  Will do urine microscopy to rule out occult bleeding

## 2022-10-24 NOTE — Assessment & Plan Note (Signed)
Incidental finding on imaging likely hemangioma.  Patient due for repeat ultrasound ultrasound order placed today.  Patient given information to call and set up

## 2022-10-24 NOTE — Assessment & Plan Note (Signed)
Longstanding history of the same has been evaluated by neurology has tried several medical therapies patient just currently deals with it pending B12 level

## 2022-10-24 NOTE — Assessment & Plan Note (Signed)
History of the same.  Pending vitamin D level 

## 2022-10-24 NOTE — Patient Instructions (Signed)
Nice to see you today Call and schedule the mammogram and the Liver US Follow up with me in 1 year, sooner if you need me Get your labs drawn fasting over the next 2 weeks.

## 2022-10-24 NOTE — Assessment & Plan Note (Signed)
Patient has not tried any medical therapies in the past.  We did discuss possibilities such as black cohosh, SSRI or SNRI.

## 2022-10-24 NOTE — Assessment & Plan Note (Signed)
Historical diagnosis patient is leading a healthy lifestyle pending lipid panel

## 2022-10-24 NOTE — Progress Notes (Signed)
Established Patient Office Visit  Subjective   Patient ID: Beverly Cook, female    DOB: [birth year]  Age: 57 y.o. MRN: [id number]  Chief Complaint  Patient presents with   Establish Care    Transferring from Dr. Selena Batten    HPI  Transfer of care: Last seen by Dr cody 10/26/2021 for CPE   GERD: states that she does use it 4 times a week. States that two weeks ago she stopped coffee. States that it might have made   Cardiology: states that she was seen by  Julien Nordmann, MD in the past. States that she had chest pain. States they checked her gallbladder and it was ok. They did notice a mass on the liver that needs follow up    Derm: [facility] once a year   Back pain : states she will use it once every 2 weeks. States that the tumeric will help.  Burning in the feet: has been going on for 8 years.  Patient is seen neurology in the past she has tried gabapentin was offered to try Lyrica and had tried amitriptyline.  She is not currently on any medical therapy  for complete physical and follow up of chronic conditions.  Immunizations: -Tetanus: Completed in 2019 -Influenza: Completed this season -Shingles: Completed Shingrix series -Pneumonia: Too young Covid: pfizer x2 and booster   Diet: Fair diet. 3 meals a day with a night time snack. Hot black tea and water. Exercise: No regular exercise. 2-3 times a week. with cardio. Walks daily after work   Eye exam: readers. Will go every 2 years   Dental exam: Completes semi-annually    Colonoscopy: Completed in 02/15/2022 likely 10 year recall  Lung Cancer Screening: N/A  Pap smear: History of a total hysterectomy  Mammogram: [facility].  Patient will be due this month  Sleep: 10 pm is bedtime. States that she falls asleep decently. Will sleep 3-4 hours then wake up with hot flashes. Difficulty going back to sleep . Does not feel rested all the time.      Review of Systems  Constitutional:  Negative  for chills and fever.  Respiratory:  Negative for shortness of breath.   Cardiovascular:  Negative for chest pain and leg swelling.  Gastrointestinal:  Negative for abdominal pain, blood in stool, constipation, diarrhea, nausea and vomiting.       BM daily   Genitourinary:  Negative for dysuria and hematuria.  Neurological:  Negative for tingling and headaches.  Psychiatric/Behavioral:  Negative for hallucinations and suicidal ideas.       Objective:     BP 116/70   Pulse 72   Temp 97.8 F (36.6 C)   Resp 16   Ht 5' 4.5" (1.638 m)   Wt 130 lb 4 oz (59.1 kg)   SpO2 96%   BMI 22.01 kg/m    Physical Exam Vitals and nursing note reviewed.  Constitutional:      Appearance: Normal appearance.  HENT:     Right Ear: Tympanic membrane, ear canal and external ear normal.     Left Ear: Tympanic membrane, ear canal and external ear normal.     Mouth/Throat:     Mouth: Mucous membranes are moist.     Pharynx: Oropharynx is clear.  Eyes:     Extraocular Movements: Extraocular movements intact.     Pupils: Pupils are equal, round, and reactive to light.  Cardiovascular:     Rate and Rhythm: Normal rate and regular  rhythm.     Pulses: Normal pulses.     Heart sounds: Normal heart sounds.  Pulmonary:     Effort: Pulmonary effort is normal.     Breath sounds: Normal breath sounds.  Abdominal:     General: Bowel sounds are normal. There is no distension.     Palpations: There is no mass.     Hernia: No hernia is present.  Musculoskeletal:     Right lower leg: No edema.     Left lower leg: No edema.  Lymphadenopathy:     Cervical: No cervical adenopathy.  Skin:    General: Skin is warm.  Neurological:     General: No focal deficit present.     Mental Status: She is alert.     Deep Tendon Reflexes:     Reflex Scores:      Bicep reflexes are 2+ on the right side and 2+ on the left side.      Patellar reflexes are 2+ on the right side and 2+ on the left side.    Comments:  Bilateral upper and lower extremity strength 5/5  Psychiatric:        Mood and Affect: Mood normal.        Behavior: Behavior normal.        Thought Content: Thought content normal.        Judgment: Judgment normal.      No results found for any visits on 10/24/22.    The 10-year ASCVD risk score (Arnett DK, et al., 2019) is: 2.3%    Assessment & Plan:   Problem List Items Addressed This Visit       Cardiovascular and Mediastinum   Hot flashes due to menopause - Primary    Patient has not tried any medical therapies in the past.  We did discuss possibilities such as black cohosh, SSRI or SNRI.      Relevant Orders   CBC   Comprehensive metabolic panel     Musculoskeletal and Integument   Spondylosis of lumbar region without myelopathy or radiculopathy    History of the same.  She will use meloxicam as needed.  Turmeric over-the-counter does help with her pain        Other   Former smoker    Former smoker does not qualify for LDCT.  Will do urine microscopy to rule out occult bleeding      Relevant Orders   Urine Microscopic   HLD (hyperlipidemia)    Historical diagnosis patient is leading a healthy lifestyle pending lipid panel      Relevant Orders   Lipid panel   Vitamin D deficiency    History of the same.  Pending vitamin D level      Relevant Orders   VITAMIN D 25 Hydroxy (Vit-D Deficiency, Fractures)   Heart burn    Patient will use Pepcid as needed.  She recently cut out coffee has been a trial that for 1 month to see if this is beneficial in her GERD symptoms      Burning sensation of feet    Longstanding history of the same has been evaluated by neurology has tried several medical therapies patient just currently deals with it pending B12 level      Relevant Orders   Vitamin B12   TSH   Liver mass    Incidental finding on imaging likely hemangioma.  Patient due for repeat ultrasound ultrasound order placed today.  Patient given information  to call and  set up      Relevant Orders   CBC   Comprehensive metabolic panel   US Abdomen Limited RUQ (LIVER/GB)   Other Visit Diagnoses     Encounter to establish care       Screening mammogram for breast cancer       Relevant Orders   MM 3D SCREENING MAMMOGRAM BILATERAL BREAST       Return in about 1 year (around 10/24/2023) for CPE and Labs.    Audria Nine, NP

## 2022-10-24 NOTE — Assessment & Plan Note (Signed)
Patient will use Pepcid as needed.  She recently cut out coffee has been a trial that for 1 month to see if this is beneficial in her GERD symptoms

## 2022-11-06 ENCOUNTER — Ambulatory Visit
Admission: RE | Admit: 2022-11-06 | Discharge: 2022-11-06 | Disposition: A | Discharge status: Home / Self Care | Payer: Federal, State, Local not specified - PPO | Source: Ambulatory Visit | Attending: Nurse Practitioner | Admitting: Nurse Practitioner

## 2022-11-06 ENCOUNTER — Other Ambulatory Visit
Admission: RE | Admit: 2022-11-06 | Discharge: 2022-11-06 | Disposition: A | Discharge status: Home / Self Care | Payer: Federal, State, Local not specified - PPO | Source: Ambulatory Visit | Attending: Nurse Practitioner | Admitting: Nurse Practitioner

## 2022-11-06 DIAGNOSIS — R160 Hepatomegaly, not elsewhere classified: Secondary | ICD-10-CM

## 2022-11-06 DIAGNOSIS — E785 Hyperlipidemia, unspecified: Secondary | ICD-10-CM | POA: Insufficient documentation

## 2022-11-06 DIAGNOSIS — R208 Other disturbances of skin sensation: Secondary | ICD-10-CM | POA: Insufficient documentation

## 2022-11-06 DIAGNOSIS — Z87891 Personal history of nicotine dependence: Secondary | ICD-10-CM

## 2022-11-06 DIAGNOSIS — E559 Vitamin D deficiency, unspecified: Secondary | ICD-10-CM | POA: Diagnosis not present

## 2022-11-06 DIAGNOSIS — N951 Menopausal and female climacteric states: Secondary | ICD-10-CM | POA: Diagnosis not present

## 2022-11-06 LAB — URINALYSIS, COMPLETE (UACMP) WITH MICROSCOPIC
Bacteria, UA: NONE SEEN
Bilirubin Urine: NEGATIVE
Glucose, UA: NEGATIVE mg/dL
Hgb urine dipstick: NEGATIVE
Ketones, ur: NEGATIVE mg/dL
Nitrite: NEGATIVE
Protein, ur: NEGATIVE mg/dL
Specific Gravity, Urine: 1.021 (ref 1.005–1.030)
pH: 6 (ref 5.0–8.0)

## 2022-11-06 LAB — CBC
HCT: 45.1 % (ref 36.0–46.0)
Hemoglobin: 15.1 g/dL — ABNORMAL HIGH (ref 12.0–15.0)
MCH: 29 pg (ref 26.0–34.0)
MCHC: 33.5 g/dL (ref 30.0–36.0)
MCV: 86.6 fL (ref 80.0–100.0)
Platelets: 234 10*3/uL (ref 150–400)
RBC: 5.21 MIL/uL — ABNORMAL HIGH (ref 3.87–5.11)
RDW: 12.2 % (ref 11.5–15.5)
WBC: 5.8 10*3/uL (ref 4.0–10.5)
nRBC: 0 % (ref 0.0–0.2)

## 2022-11-06 LAB — COMPREHENSIVE METABOLIC PANEL
ALT: 21 U/L (ref 0–44)
AST: 22 U/L (ref 15–41)
Albumin: 4.8 g/dL (ref 3.5–5.0)
Alkaline Phosphatase: 71 U/L (ref 38–126)
Anion gap: 9 (ref 5–15)
BUN: 13 mg/dL (ref 6–20)
CO2: 29 mmol/L (ref 22–32)
Calcium: 9.4 mg/dL (ref 8.9–10.3)
Chloride: 104 mmol/L (ref 98–111)
Creatinine, Ser: 0.69 mg/dL (ref 0.44–1.00)
GFR, Estimated: >60 mL/min (ref >60)
Glucose, Bld: 89 mg/dL (ref 70–99)
Potassium: 3.7 mmol/L (ref 3.5–5.1)
Sodium: 142 mmol/L (ref 135–145)
Total Bilirubin: 1.1 mg/dL (ref 0.3–1.2)
Total Protein: 7.8 g/dL (ref 6.5–8.1)

## 2022-11-06 LAB — LIPID PANEL
Cholesterol: 281 mg/dL — ABNORMAL HIGH (ref 0–200)
HDL: 81 mg/dL (ref >40)
LDL Cholesterol: 177 mg/dL — ABNORMAL HIGH (ref 0–99)
Total CHOL/HDL Ratio: 3.5 RATIO
Triglycerides: 116 mg/dL (ref <150)
VLDL: 23 mg/dL (ref 0–40)

## 2022-11-06 LAB — VITAMIN B12: Vitamin B-12: 494 pg/mL (ref 180–914)

## 2022-11-06 LAB — TSH: TSH: 0.879 u[IU]/mL (ref 0.350–4.500)

## 2022-11-06 LAB — VITAMIN D 25 HYDROXY (VIT D DEFICIENCY, FRACTURES): Vit D, 25-Hydroxy: 26.14 ng/mL — ABNORMAL LOW (ref 30–100)

## 2022-11-06 NOTE — Addendum Note (Signed)
Addended by: Gaytha Raybourn J on: 11/06/2022 08:46 AM   Modules accepted: Orders  

## 2022-11-06 NOTE — Addendum Note (Signed)
Addended by: Sandi Raveling on: 11/06/2022 08:46 AM   Modules accepted: Orders

## 2022-11-06 NOTE — Addendum Note (Signed)
Addended by: Bliss Tsang J on: 11/06/2022 08:46 AM   Modules accepted: Orders  

## 2022-11-06 NOTE — Addendum Note (Signed)
Addended by: Sandi Raveling on: 11/06/2022 08:47 AM   Modules accepted: Orders

## 2022-11-06 NOTE — Addendum Note (Signed)
Addended by: Deontray Hunnicutt J on: 11/06/2022 08:47 AM   Modules accepted: Orders  

## 2022-11-06 NOTE — Addendum Note (Signed)
Addended by: Gretta Samons J on: 11/06/2022 08:47 AM   Modules accepted: Orders  

## 2022-11-06 NOTE — Addendum Note (Signed)
Addended by: Marybelle Giraldo J on: 11/06/2022 08:47 AM   Modules accepted: Orders  

## 2022-11-13 ENCOUNTER — Ambulatory Visit
Admission: RE | Admit: 2022-11-13 | Discharge: 2022-11-13 | Disposition: A | Discharge status: Home / Self Care | Payer: Federal, State, Local not specified - PPO | Source: Ambulatory Visit | Attending: Nurse Practitioner | Admitting: Nurse Practitioner

## 2022-11-13 DIAGNOSIS — Z1231 Encounter for screening mammogram for malignant neoplasm of breast: Secondary | ICD-10-CM | POA: Diagnosis not present

## 2022-12-03 ENCOUNTER — Other Ambulatory Visit: Payer: Self-pay | Admitting: Nurse Practitioner

## 2023-05-21 ENCOUNTER — Ambulatory Visit: Payer: Federal, State, Local not specified - PPO | Admitting: Dermatology

## 2023-05-21 DIAGNOSIS — D2371 Other benign neoplasm of skin of right lower limb, including hip: Secondary | ICD-10-CM

## 2023-05-21 DIAGNOSIS — Z1283 Encounter for screening for malignant neoplasm of skin: Secondary | ICD-10-CM

## 2023-05-21 DIAGNOSIS — L304 Erythema intertrigo: Secondary | ICD-10-CM | POA: Diagnosis not present

## 2023-05-21 DIAGNOSIS — Z79899 Other long term (current) drug therapy: Secondary | ICD-10-CM

## 2023-05-21 DIAGNOSIS — L578 Other skin changes due to chronic exposure to nonionizing radiation: Secondary | ICD-10-CM

## 2023-05-21 DIAGNOSIS — L814 Other melanin hyperpigmentation: Secondary | ICD-10-CM

## 2023-05-21 DIAGNOSIS — B079 Viral wart, unspecified: Secondary | ICD-10-CM

## 2023-05-21 DIAGNOSIS — D1801 Hemangioma of skin and subcutaneous tissue: Secondary | ICD-10-CM

## 2023-05-21 DIAGNOSIS — W908XXA Exposure to other nonionizing radiation, initial encounter: Secondary | ICD-10-CM | POA: Diagnosis not present

## 2023-05-21 DIAGNOSIS — L821 Other seborrheic keratosis: Secondary | ICD-10-CM

## 2023-05-21 DIAGNOSIS — Z7189 Other specified counseling: Secondary | ICD-10-CM

## 2023-05-21 DIAGNOSIS — D239 Other benign neoplasm of skin, unspecified: Secondary | ICD-10-CM

## 2023-05-21 DIAGNOSIS — D229 Melanocytic nevi, unspecified: Secondary | ICD-10-CM

## 2023-05-21 MED ORDER — FLUOROURACIL 5 % EX CREA: TOPICAL_CREAM | CUTANEOUS | 0 refills | Status: DC

## 2023-05-21 NOTE — Progress Notes (Signed)
Follow-Up Visit   Subjective  Beverly Cook is a 57 y.o. female who presents for the following: Skin Cancer Screening and Full Body Skin Exam  The patient presents for Total-Body Skin Exam (TBSE) for skin cancer screening and mole check. The patient has spots, moles and lesions to be evaluated, some may be new or changing and the patient may have concern these could be cancer.  The following portions of the chart were reviewed this encounter and updated as appropriate: medications, allergies, medical history  Review of Systems:  No other skin or systemic complaints except as noted in HPI or Assessment and Plan.  Objective  Well appearing patient in no apparent distress; mood and affect are within normal limits.  A full examination was performed including scalp, head, eyes, ears, nose, lips, neck, chest, axillae, abdomen, back, buttocks, bilateral upper extremities, bilateral lower extremities, hands, feet, fingers, toes, fingernails, and toenails. All findings within normal limits unless otherwise noted below.   Relevant physical exam findings are noted in the Assessment and Plan. R great toe x 2, R ant sole x 1, R heel x 1 (4) Verrucous papules -- Discussed viral etiology and contagion.   Assessment & Plan   SKIN CANCER SCREENING PERFORMED TODAY.  ACTINIC DAMAGE - Chronic condition, secondary to cumulative UV/sun exposure - diffuse scaly erythematous macules with underlying dyspigmentation - Recommend daily broad spectrum sunscreen SPF 30+ to sun-exposed areas, reapply every 2 hours as needed.  - Staying in the shade or wearing long sleeves, sun glasses (UVA+UVB protection) and wide brim hats (4-inch brim around the entire circumference of the hat) are also recommended for sun protection.  - Call for new or changing lesions.  LENTIGINES, SEBORRHEIC KERATOSES, HEMANGIOMAS - Benign normal skin lesions - Benign-appearing - Call for any changes Counseling for BBL / IPL / Laser and  Coordination of Care Discussed the treatment option of Broad Band Light (BBL) /Intense Pulsed Light (IPL)/ Laser for skin discoloration, including brown spots and redness.  Typically we recommend at least 1-3 treatment sessions about 5-8 weeks apart for best results.  Cannot have tanned skin when BBL performed, and regular use of sunscreen/photoprotection is advised after the procedure to help maintain results. The patient's condition may also require "maintenance treatments" in the future.  The fee for BBL / laser treatments is $350 per treatment session for the whole face.  A fee can be quoted for other parts of the body.  Insurance typically does not pay for BBL/laser treatments and therefore the fee is an out-of-pocket cost. Recommend prophylactic valtrex treatment. Once scheduled for procedure, will send Rx in prior to patient's appointment.   MELANOCYTIC NEVI - Tan-brown and/or pink-flesh-colored symmetric macules and papules - Benign appearing on exam today - Observation - Call clinic for new or changing moles - Recommend daily use of broad spectrum spf 30+ sunscreen to sun-exposed areas.   DERMATOFIBROMA - R thigh/hip Exam: Firm pink/brown papulenodule with dimple sign. Treatment Plan: A dermatofibroma is a benign growth possibly related to trauma, such as an insect bite, cut from shaving, or inflamed acne-type bump.  Treatment options to remove include shave or excision with resulting scar and risk of recurrence.  Since benign-appearing and not bothersome, will observe for now.   VIRAL WARTS, UNSPECIFIED TYPE (4) R great toe x 2, R ant sole x 1, R heel x 1 (4) Viral Wart (HPV) Counseling  Discussed viral / HPV (Human Papilloma Virus) etiology and risk of spread /infectivity to other areas  of body as well as to other people.  Multiple treatments and methods may be required to clear warts and it is possible treatment may not be successful.  Treatment risks include discoloration; scarring and  there is still potential for wart recurrence.  Start Rx 5-FU/Sal acid wart paste QHS under occulusion.  Destruction of lesion - R great toe x 2, R ant sole x 1, R heel x 1 (4) Complexity: simple   Destruction method: cryotherapy   Informed consent: discussed and consent obtained   Timeout:  patient name, date of birth, surgical site, and procedure verified Lesion destroyed using liquid nitrogen: Yes   Region frozen until ice ball extended beyond lesion: Yes   Outcome: patient tolerated procedure well with no complications   Post-procedure details: wound care instructions given   INTERTRIGO   COUNSELING AND COORDINATION OF CARE   ACTINIC SKIN DAMAGE   LENTIGO   MELANOCYTIC NEVUS, UNSPECIFIED LOCATION   SKIN CANCER SCREENING   DERMATOFIBROMA   MEDICATION MANAGEMENT     INTERTRIGO Exam: Erythematous macerated patches in body folds  Chronic and persistent condition with duration or expected duration over one year. Condition is bothersome/symptomatic for patient. Currently flared.  Intertrigo is a chronic recurrent rash that occurs in skin fold areas that may be associated with friction; heat; moisture; yeast; fungus; and bacteria.  It is exacerbated by increased movement / activity; sweating; and higher atmospheric temperature.  Use of an absorbant powder such as Zeasorb AF powder or other OTC antifungal powder to the area daily can prevent rash recurrence. Other options to help keep the area dry include blow drying the area after bathing or using antiperspirant products such as Duradry sweat minimizing gel.  Treatment Plan: Recommend Zeasorb AF powder daily between the toes.   Return in about 1 year (around 05/20/2024) for TBSE.  Maylene Roes, CMA, am acting as scribe for Armida Sans, MD .  Documentation: I have reviewed the above documentation for accuracy and completeness, and I agree with the above.  Armida Sans, MD

## 2023-05-21 NOTE — Patient Instructions (Addendum)
Instructions for [facility] Medications  One or more of your medications was sent to the [facility] mail order compounding pharmacy. You will receive an email from them and can purchase the medicine through that link. It will then be mailed to your home at the address you confirmed. If for any reason you do not receive an email from them, please check your spam folder. If you still do not find the email, please let us know. [facility] phone number is [phone].       Due to recent changes in healthcare laws, you may see results of your pathology and/or laboratory studies on MyChart before the doctors have had a chance to review them. We understand that in some cases there may be results that are confusing or concerning to you. Please understand that not all results are received at the same time and often the doctors may need to interpret multiple results in order to provide you with the best plan of care or course of treatment. Therefore, we ask that you please give [place] 2 business days to thoroughly review all your results before contacting the office for clarification. Should we see a critical lab result, you will be contacted sooner.   If You Need Anything After Your Visit  If you have any questions or concerns for your doctor, please call our main line at [phone] and press option 4 to reach your doctor's medical assistant. If no one answers, please leave a voicemail as directed and we will return your call as soon as possible. Messages left after 4 pm will be answered the following business day.   You may also send [place] a message via MyChart. We typically respond to MyChart messages within 1-2 business days.  For prescription refills, please ask your pharmacy to contact our office. Our fax number is [phone].  If you have an urgent issue when the clinic is closed that cannot wait until the next business day, you can page your doctor at the number below.    Please note  that while we do our best to be available for urgent issues outside of office hours, we are not available 24/7.   If you have an urgent issue and are unable to reach [place], you may choose to seek medical care at your doctor's office, retail clinic, urgent care center, or emergency room.  If you have a medical emergency, please immediately call 911 or go to the emergency department.  Pager Numbers  - Dr. Gwen Pounds: [phone]  - Dr. Roseanne Reno: [phone]  - Dr. Katrinka Blazing: [phone]   In the event of inclement weather, please call our main line at [phone] for an update on the status of any delays or closures.  Dermatology Medication Tips: Please keep the boxes that topical medications come in in order to help keep track of the instructions about where and how to use these. Pharmacies typically print the medication instructions only on the boxes and not directly on the medication tubes.   If your medication is too expensive, please contact our office at [phone] option 4 or send [place] a message through MyChart.   We are unable to tell what your co-pay for medications will be in advance as this is different depending on your insurance coverage. However, we may be able to find a substitute medication at lower cost or fill out paperwork to get insurance to cover a needed medication.   If a prior authorization is required to get your medication covered by your  insurance company, please allow [place] 1-2 business days to complete this process.  Drug prices often vary depending on where the prescription is filled and some pharmacies may offer cheaper prices.  The website www.goodrx.com contains coupons for medications through different pharmacies. The prices here do not account for what the cost may be with help from insurance (it may be cheaper with your insurance), but the website can give you the price if you did not use any insurance.  - You can print the associated coupon and take it with your  prescription to the pharmacy.  - You may also stop by our office during regular business hours and pick up a GoodRx coupon card.  - If you need your prescription sent electronically to a different pharmacy, notify our office through [facility] or by phone at [phone] option 4.     Si Usted Necesita Algo Despus de Su Visita  Tambin puede enviarnos un mensaje a travs de Clinical cytogeneticist. Por lo general respondemos a los mensajes de MyChart en el transcurso de 1 a 2 das hbiles.  Para renovar recetas, por favor pida a su farmacia que se ponga en contacto con nuestra oficina. Annie Sable de fax es [place] [phone].  Si tiene un asunto urgente cuando la clnica est cerrada y que no puede esperar hasta el siguiente da hbil, puede llamar/localizar a su doctor(a) al nmero que aparece a continuacin.   Por favor, tenga en cuenta que aunque hacemos todo lo posible para estar disponibles para asuntos urgentes fuera del horario de [place], no estamos disponibles las 24 horas del da, los 7 [address]  [address] de la [place].   Si tiene un problema urgente y no puede comunicarse con nosotros, puede optar por buscar atencin mdica  en el consultorio de su doctor(a), en una clnica privada, en un centro de atencin urgente o en una sala de emergencias.  Si tiene Engineer, drilling, por favor llame inmediatamente al 911 o vaya a la sala de emergencias.  Nmeros de bper  - Dr. Gwen Pounds: [phone]  - Dra. Roseanne Reno: [phone]  - Dr. Katrinka Blazing: [phone]   En caso de inclemencias del tiempo, por favor llame a Lacy Duverney principal al [phone] para una actualizacin sobre el [place] de cualquier retraso o cierre.  Consejos para la medicacin en dermatologa: Por favor, guarde las cajas en las que vienen los medicamentos de uso tpico para ayudarle a seguir las instrucciones sobre dnde y cmo usarlos. Las farmacias generalmente imprimen las instrucciones del medicamento slo en las cajas y no  directamente en los tubos del [place].   Si su medicamento es muy caro, por favor, pngase en contacto con Rolm Gala llamando al [phone] y presione la opcin 4 o envenos un mensaje a travs de Clinical cytogeneticist.   No podemos decirle cul ser su copago por los medicamentos por adelantado ya que esto es diferente dependiendo de la cobertura de su seguro. Sin embargo, es posible que podamos encontrar un medicamento sustituto a Audiological scientist un formulario para que el seguro cubra el medicamento que se considera necesario.   Si se requiere una autorizacin previa para que su compaa de seguros [place] su medicamento, por favor permtanos de 1 a 2 das hbiles para completar [address]  Los precios de los medicamentos varan con frecuencia dependiendo del Environmental consultant de dnde se surte la receta y alguna farmacias pueden ofrecer precios ms baratos.  El sitio web www.goodrx.com tiene cupones para medicamentos de Health and safety inspector. Los precios aqu no tienen  en cuenta lo que podra costar con la ayuda del seguro (puede ser ms barato con su seguro), pero el sitio web puede darle el precio si no Visual merchandiser.  - Puede imprimir el cupn correspondiente y llevarlo con su receta a la farmacia.  - Tambin puede pasar por nuestra oficina durante el horario de atencin regular y Education officer, museum una tarjeta de cupones de GoodRx.  - Si necesita que su receta se enve electrnicamente a una farmacia diferente, informe a nuestra oficina a travs de MyChart de [place] o por telfono llamando al [phone] y presione la opcin 4.

## 2023-05-25 ENCOUNTER — Encounter: Payer: Self-pay | Admitting: Dermatology

## 2023-06-18 ENCOUNTER — Encounter: Payer: Self-pay | Admitting: Family Medicine

## 2023-06-18 ENCOUNTER — Other Ambulatory Visit (HOSPITAL_COMMUNITY)
Admission: RE | Admit: 2023-06-18 | Discharge: 2023-06-18 | Disposition: A | Discharge status: Home / Self Care | Payer: Federal, State, Local not specified - PPO | Source: Ambulatory Visit | Attending: Family Medicine | Admitting: Family Medicine

## 2023-06-18 ENCOUNTER — Ambulatory Visit (INDEPENDENT_AMBULATORY_CARE_PROVIDER_SITE_OTHER): Payer: Federal, State, Local not specified - PPO | Admitting: Family Medicine

## 2023-06-18 VITALS — BP 111/74 | HR 75 | Wt 126.8 lb

## 2023-06-18 DIAGNOSIS — Z01419 Encounter for gynecological examination (general) (routine) without abnormal findings: Secondary | ICD-10-CM

## 2023-06-18 DIAGNOSIS — N951 Menopausal and female climacteric states: Secondary | ICD-10-CM | POA: Diagnosis not present

## 2023-06-18 DIAGNOSIS — N898 Other specified noninflammatory disorders of vagina: Secondary | ICD-10-CM | POA: Diagnosis present

## 2023-06-18 MED ORDER — INTRAROSA 6.5 MG VA INST: 1.0000 | VAGINAL_INSERT | Freq: Every day | VAGINAL | 1 refills | Status: AC

## 2023-06-18 NOTE — Progress Notes (Signed)
 Patient presents for Annual.  LMP: Hysterectomy  Last pap:  Had Hysterectomy  Contraception:  Hysterectomy Mammogram: Up to date: 11/13/22 STD Screening: not indicated Flu Vaccine : N/A  CC:  Vaginal discharge yellowish x months, denies any vaginal itching

## 2023-06-18 NOTE — Assessment & Plan Note (Signed)
 Discussed with patient options. Will try intrarosa--samples of Uberlube given.

## 2023-06-18 NOTE — Progress Notes (Signed)
 Subjective:     Beverly Cook is a 58 y.o. female and is here for a comprehensive physical exam. The patient reports problems - vaginal irritation . She is s/p TAHBSO in 2009. No h/o abnl pap. Vaginal yellow discharge. Notes some tearing with intercourse. Especially under the urethra. She has used estrogen cream in the past. She is uncomfortable using estrogen as she has a strong family breast cancer.   The following portions of the patient's history were reviewed and updated as appropriate: allergies, current medications, past family history, past medical history, past social history, past surgical history, and problem list.  Review of Systems Pertinent items noted in HPI and remainder of comprehensive ROS otherwise negative.   Objective:    BP 111/74   Pulse 75   Wt 126 lb 12.8 oz (57.5 kg)   BMI 21.43 kg/m  General appearance: alert, cooperative, and appears stated age Head: Normocephalic, without obvious abnormality, atraumatic Lungs: clear to auscultation bilaterally Heart: regular rate and rhythm, S1, S2 normal, no murmur, click, rub or gallop Abdomen: soft, non-tender; bowel sounds normal; no masses,  no organomegaly Pelvic: external genitalia normal, uterus surgically absent, and vaginal atrophy Extremities: extremities normal, atraumatic, no cyanosis or edema Pulses: 2+ and symmetric Skin: Skin color, texture, turgor normal. No rashes or lesions    Assessment:    GYN female exam.      Plan:   Problem List Items Addressed This Visit       Unprioritized   Vaginal dryness, menopausal   Discussed with patient options. Will try intrarosa --samples of Uberlube given.      Relevant Medications   Prasterone  (INTRAROSA ) 6.5 MG INST   Other Visit Diagnoses       Encounter for gynecological examination without abnormal finding    -  Primary     Vaginal discharge       check wet prep and treat accordingly   Relevant Orders   Cervicovaginal ancillary only          See After Visit Summary for Counseling Recommendations

## 2023-06-20 LAB — CERVICOVAGINAL ANCILLARY ONLY
Bacterial Vaginitis (gardnerella): POSITIVE — AB
Candida Glabrata: NEGATIVE
Candida Vaginitis: NEGATIVE
Comment: NEGATIVE
Comment: NEGATIVE
Comment: NEGATIVE

## 2023-06-20 MED ORDER — METRONIDAZOLE 500 MG PO TABS: 500.0000 mg | ORAL_TABLET | Freq: Two times a day (BID) | ORAL | 0 refills | Status: AC

## 2023-06-20 NOTE — Addendum Note (Signed)
 Addended by: Reva Bores on: 06/20/2023 01:54 PM   Modules accepted: Orders

## 2023-09-02 ENCOUNTER — Encounter: Payer: Self-pay | Admitting: Family Medicine

## 2023-09-02 DIAGNOSIS — N898 Other specified noninflammatory disorders of vagina: Secondary | ICD-10-CM

## 2023-09-03 MED ORDER — METRONIDAZOLE 500 MG PO TABS: 500.0000 mg | ORAL_TABLET | Freq: Two times a day (BID) | ORAL | 0 refills | Status: AC

## 2023-12-09 ENCOUNTER — Encounter: Payer: Self-pay | Admitting: Family Medicine

## 2023-12-10 ENCOUNTER — Other Ambulatory Visit: Payer: Self-pay | Admitting: *Deleted

## 2023-12-10 DIAGNOSIS — Z1231 Encounter for screening mammogram for malignant neoplasm of breast: Secondary | ICD-10-CM

## 2023-12-18 ENCOUNTER — Ambulatory Visit
Admission: RE | Admit: 2023-12-18 | Discharge: 2023-12-18 | Disposition: A | Discharge status: Home / Self Care | Source: Ambulatory Visit | Attending: Family Medicine | Admitting: Family Medicine

## 2023-12-18 DIAGNOSIS — Z1231 Encounter for screening mammogram for malignant neoplasm of breast: Secondary | ICD-10-CM | POA: Insufficient documentation

## 2023-12-24 ENCOUNTER — Ambulatory Visit: Payer: Self-pay | Admitting: Family Medicine

## 2024-01-14 ENCOUNTER — Ambulatory Visit
Admission: RE | Admit: 2024-01-14 | Discharge: 2024-01-14 | Disposition: A | Discharge status: Home / Self Care | Source: Ambulatory Visit | Attending: Physician Assistant | Admitting: Physician Assistant

## 2024-01-14 ENCOUNTER — Other Ambulatory Visit: Payer: Self-pay | Admitting: Physician Assistant

## 2024-01-14 DIAGNOSIS — Y990 Civilian activity done for income or pay: Secondary | ICD-10-CM

## 2024-01-14 DIAGNOSIS — W208XXA Other cause of strike by thrown, projected or falling object, initial encounter: Secondary | ICD-10-CM | POA: Diagnosis not present

## 2024-01-14 DIAGNOSIS — S52502A Unspecified fracture of the lower end of left radius, initial encounter for closed fracture: Secondary | ICD-10-CM | POA: Insufficient documentation
